# Patient Record
Sex: Female | Born: 1970 | Race: Black or African American | Hispanic: No | Marital: Married | Smoking: Never smoker
Health system: Southern US, Community
[De-identification: ages and names within clinical notes are randomized; demographics above are authoritative.]

## PROBLEM LIST (undated history)

## (undated) DIAGNOSIS — T783XXA Angioneurotic edema, initial encounter: Secondary | ICD-10-CM

## (undated) DIAGNOSIS — L509 Urticaria, unspecified: Secondary | ICD-10-CM

## (undated) DIAGNOSIS — F329 Major depressive disorder, single episode, unspecified: Secondary | ICD-10-CM

## (undated) DIAGNOSIS — G43909 Migraine, unspecified, not intractable, without status migrainosus: Secondary | ICD-10-CM

## (undated) DIAGNOSIS — D649 Anemia, unspecified: Secondary | ICD-10-CM

## (undated) DIAGNOSIS — I10 Essential (primary) hypertension: Secondary | ICD-10-CM

## (undated) DIAGNOSIS — K319 Disease of stomach and duodenum, unspecified: Secondary | ICD-10-CM

## (undated) DIAGNOSIS — K219 Gastro-esophageal reflux disease without esophagitis: Secondary | ICD-10-CM

## (undated) DIAGNOSIS — L309 Dermatitis, unspecified: Secondary | ICD-10-CM

## (undated) DIAGNOSIS — F32A Depression, unspecified: Secondary | ICD-10-CM

## (undated) HISTORY — PX: TUBAL LIGATION: SHX77

## (undated) HISTORY — DX: Anemia, unspecified: D64.9

## (undated) HISTORY — DX: Disease of stomach and duodenum, unspecified: K31.9

## (undated) HISTORY — DX: Angioneurotic edema, initial encounter: T78.3XXA

## (undated) HISTORY — DX: Essential (primary) hypertension: I10

## (undated) HISTORY — DX: Urticaria, unspecified: L50.9

## (undated) HISTORY — DX: Migraine, unspecified, not intractable, without status migrainosus: G43.909

## (undated) HISTORY — DX: Dermatitis, unspecified: L30.9

---

## 2010-07-14 ENCOUNTER — Encounter: Payer: Self-pay | Admitting: Family Medicine

## 2010-07-14 DIAGNOSIS — G43909 Migraine, unspecified, not intractable, without status migrainosus: Secondary | ICD-10-CM | POA: Insufficient documentation

## 2010-07-14 DIAGNOSIS — I10 Essential (primary) hypertension: Secondary | ICD-10-CM | POA: Insufficient documentation

## 2010-07-14 DIAGNOSIS — D649 Anemia, unspecified: Secondary | ICD-10-CM | POA: Insufficient documentation

## 2011-10-09 ENCOUNTER — Emergency Department (INDEPENDENT_AMBULATORY_CARE_PROVIDER_SITE_OTHER)
Admission: EM | Admit: 2011-10-09 | Discharge: 2011-10-09 | Disposition: A | Discharge status: Home / Self Care | Source: Home / Self Care | Attending: Emergency Medicine | Admitting: Emergency Medicine

## 2011-10-09 ENCOUNTER — Emergency Department (INDEPENDENT_AMBULATORY_CARE_PROVIDER_SITE_OTHER)

## 2011-10-09 ENCOUNTER — Encounter (HOSPITAL_COMMUNITY): Payer: Self-pay | Admitting: Emergency Medicine

## 2011-10-09 DIAGNOSIS — M25519 Pain in unspecified shoulder: Secondary | ICD-10-CM

## 2011-10-09 DIAGNOSIS — S46009A Unspecified injury of muscle(s) and tendon(s) of the rotator cuff of unspecified shoulder, initial encounter: Secondary | ICD-10-CM

## 2011-10-09 DIAGNOSIS — M25512 Pain in left shoulder: Secondary | ICD-10-CM

## 2011-10-09 DIAGNOSIS — S4980XA Other specified injuries of shoulder and upper arm, unspecified arm, initial encounter: Secondary | ICD-10-CM

## 2011-10-09 HISTORY — DX: Depression, unspecified: F32.A

## 2011-10-09 HISTORY — DX: Major depressive disorder, single episode, unspecified: F32.9

## 2011-10-09 MED ORDER — ACETAMINOPHEN-CODEINE #3 300-30 MG PO TABS: 1.0000 | ORAL_TABLET | Freq: Four times a day (QID) | ORAL | Status: DC | PRN

## 2011-10-09 MED ORDER — IBUPROFEN 600 MG PO TABS: 600.0000 mg | ORAL_TABLET | Freq: Three times a day (TID) | ORAL | Status: DC

## 2011-10-09 MED ORDER — KETOROLAC TROMETHAMINE 60 MG/2ML IM SOLN
INTRAMUSCULAR | Status: AC
  Filled 2011-10-09: qty 2

## 2011-10-09 MED ORDER — KETOROLAC TROMETHAMINE 60 MG/2ML IM SOLN
60.0000 mg | Freq: Once | INTRAMUSCULAR | Status: AC
  Administered 2011-10-09: 60 mg via INTRAMUSCULAR

## 2011-10-09 NOTE — ED Notes (Signed)
Pt is c/o left should inj since 9:30am... Pt says she fell down a flight of stairs... Sx include: limited range of motion, painful to raise left arm... She also says she hit her head but denies loss of conscious.... Thought the pain would go away and has tried hot baths for the pain.

## 2011-10-09 NOTE — ED Provider Notes (Signed)
History     CSN: [id number]  Arrival date & time 10/09/11  1707   None     Chief Complaint  Patient presents with  . Shoulder Injury    (Consider location/radiation/quality/duration/timing/severity/associated sxs/prior treatment) Patient is a 41 y.o. female presenting with shoulder injury. The history is provided by the patient.  Shoulder Injury This is a new problem. The current episode started 6 to 12 hours ago. The problem occurs constantly. The problem has not changed since onset.The symptoms are aggravated by bending, twisting and exertion. The symptoms are relieved by rest.   Patient reports she tripped going down stairs today, complains of left shoulder pain improved slightly with rest, worsened with movement.  Took tylenol for pain with no improvement.   Past Medical History  Diagnosis Date  . High blood pressure     h/o  . Migraines   . Anemia   . Depression     Past Surgical History  Procedure Date  . Tubal ligation   . Cesarean section     No family history on file.  History  Substance Use Topics  . Smoking status: Never Smoker   . Smokeless tobacco: Not on file  . Alcohol Use: No    OB History    Grav Para Term Preterm Abortions TAB SAB Ect Mult Living                  Review of Systems  Constitutional: Negative.   Respiratory: Negative.   Cardiovascular: Negative.   Musculoskeletal: Positive for arthralgias. Negative for myalgias, back pain, joint swelling and gait problem.  Skin: Negative.     Allergies  Review of patient's allergies indicates no known allergies.  Home Medications   Current Outpatient Rx  Name Route Sig Dispense Refill  . VENLAFAXINE HCL 75 MG PO TABS Oral Take 75 mg by mouth 2 (two) times daily.    . ACETAMINOPHEN-CODEINE #3 300-30 MG PO TABS Oral Take 1 tablet by mouth every 6 (six) hours as needed for pain. 20 tablet 0  . FERROUS SULFATE 325 (65 FE) MG PO TABS Oral Take 325 mg by mouth daily with breakfast.      .  IBUPROFEN 600 MG PO TABS Oral Take 1 tablet (600 mg total) by mouth 3 (three) times daily with meals. 90 tablet 1    BP 148/80  Pulse 109  Temp 99.4 F (37.4 C) (Oral)  Resp 18  Wt 119 lb (53.978 kg)  SpO2 96%  Physical Exam  Nursing note and vitals reviewed. Constitutional: She is oriented to person, place, and time. Vital signs are normal. She appears well-developed and well-nourished. She is active and cooperative.  HENT:  Head: Normocephalic.  Eyes: Conjunctivae normal are normal. Pupils are equal, round, and reactive to light. No scleral icterus.  Neck: Trachea normal. Neck supple.  Cardiovascular: Normal rate, regular rhythm, normal heart sounds and intact distal pulses.   Pulmonary/Chest: Effort normal and breath sounds normal.  Musculoskeletal:       Right shoulder: Normal.       Left shoulder: She exhibits decreased range of motion, tenderness, pain and decreased strength. She exhibits no swelling, no effusion, no crepitus, no deformity, no laceration and no spasm.  Neurological: She is alert and oriented to person, place, and time. No cranial nerve deficit or sensory deficit. GCS eye subscore is 4. GCS verbal subscore is 5. GCS motor subscore is 6.  Skin: Skin is warm and dry.  Psychiatric: She has a  normal mood and affect. Her speech is normal and behavior is normal. Judgment and thought content normal. Cognition and memory are normal.    ED Course  Procedures (including critical care time)  Labs Reviewed - No data to display Dg Shoulder Left  10/09/2011  *RADIOLOGY REPORT*  Clinical Data: Larey Seat down steps today.Left shoulder pain.  LEFT SHOULDER - 2+ VIEW  Comparison: None.  Findings: There is no evidence for acute fracture or dislocation. No soft tissue foreign body or gas identified.  IMPRESSION: Negative exam.   Original Report Authenticated By: Patterson Hammersmith, M.D.      1. Left shoulder pain   2. Rotator cuff injury       MDM  Ibuprofen and tylenol for  pain management as discussed.  Arm sling to rest affected shoulder.  Call orthopedist on Monday morning for follow up appointment and further management of shoulder pain.        Johnsie Kindred, NP 10/11/11 1409

## 2011-10-30 NOTE — ED Provider Notes (Signed)
Medical screening examination/treatment/procedure(s) were performed by non-physician practitioner and as supervising physician I was immediately available for consultation/collaboration.  Leslee Home, M.D.   Reuben Likes, MD 10/30/11 1254

## 2012-04-13 ENCOUNTER — Ambulatory Visit (INDEPENDENT_AMBULATORY_CARE_PROVIDER_SITE_OTHER): Admitting: Physician Assistant

## 2012-04-13 ENCOUNTER — Encounter: Payer: Self-pay | Admitting: Physician Assistant

## 2012-04-13 VITALS — BP 124/70 | HR 96 | Temp 98.6°F | Resp 18 | Ht 64.0 in | Wt 127.0 lb

## 2012-04-13 DIAGNOSIS — M7710 Lateral epicondylitis, unspecified elbow: Secondary | ICD-10-CM

## 2012-04-13 DIAGNOSIS — M7712 Lateral epicondylitis, left elbow: Secondary | ICD-10-CM

## 2012-04-13 DIAGNOSIS — M542 Cervicalgia: Secondary | ICD-10-CM

## 2012-04-13 DIAGNOSIS — L2089 Other atopic dermatitis: Secondary | ICD-10-CM

## 2012-04-13 DIAGNOSIS — L209 Atopic dermatitis, unspecified: Secondary | ICD-10-CM

## 2012-04-13 MED ORDER — MELOXICAM 7.5 MG PO TABS: 7.5000 mg | ORAL_TABLET | Freq: Every day | ORAL | Status: DC

## 2012-04-13 MED ORDER — CYCLOBENZAPRINE HCL 10 MG PO TABS: 10.0000 mg | ORAL_TABLET | Freq: Three times a day (TID) | ORAL | Status: DC | PRN

## 2012-04-13 MED ORDER — TRIAMCINOLONE ACETONIDE 0.1 % EX CREA: TOPICAL_CREAM | Freq: Two times a day (BID) | CUTANEOUS | Status: DC

## 2012-04-13 NOTE — Progress Notes (Signed)
Patient ID: Sayla Golonka MRN: [id number], DOB: Jun 29, 1970, 42 y.o. Date of Encounter: 04/13/2012, 3:56 PM    Chief Complaint:  Chief Complaint  Patient presents with  . Back Pain  . eczema     HPI: 42 y.o. year old female here for eval of above two problems.  1-Pain started one week ago after she "slept wrong"-had "crick" in neck. Now with tightness and pain in back of neck and upper back. 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 Does not hold small children. Has done no heavy lifting and no overhead activity recently. Does no upper body exercise. Has used no otc meds etc to treat her symptoms. Just in the past one-two days has felt some pain in left elbow as well. Otherwise, no pain, numbness, tingling down the arm.   2-Eczema: has patches on chest and back. Has no medication for this.  Home Meds: Current Outpatient Prescriptions on File Prior to Visit  Medication Sig Dispense Refill  . acetaminophen-codeine (TYLENOL #3) 300-30 MG per tablet Take 1 tablet by mouth every 6 (six) hours as needed for pain.  20 tablet  0  . ferrous sulfate 325 (65 FE) MG tablet Take 325 mg by mouth daily with breakfast.        . ibuprofen (ADVIL,MOTRIN) 600 MG tablet Take 1 tablet (600 mg total) by mouth 3 (three) times daily with meals.  90 tablet  1  . venlafaxine (EFFEXOR) 75 MG tablet Take 75 mg by mouth 2 (two) times daily.       No current facility-administered medications on file prior to visit.    Allergies: No Known Allergies    Review of Systems:Pertinent ROS in HPI. Other ROS negative.   Physical Exam: Blood pressure 124/70, pulse 96, temperature 98.6 F (37 C), temperature source Oral, resp. rate 18, height 5\' 4"  (1.626 m), weight 127 lb (57.607 kg), last menstrual period 04/03/2012., Body mass index is 21.79 kg/(m^2). General: Well developed, well nourished,AAF in no acute distress. Neck: Supple. No thyromegaly. Full ROM. No lymphadenopathy. Lungs: Clear bilaterally to  auscultation without wheezes, rales, or rhonchi. Breathing is unlabored. Heart: RRR with S1 S2. No murmurs, rubs, or gallops appreciated. Msk:  Neck: FROM intact. She points to C6-C8 area as area of pain and tightness bilaterally. Says it is mostly on left side. Left shoulder: FROM with no pain. Can fully abduct with no pain. Left Lateral Epicondyle is very painful with palpation. Also has pain at that site when she extends at elbow and hyperextends middle finger.  Extremities/Skin: on her upper back she has multiple areas of hyperpigmentation and slightly raised and slightly rough. I see no other areas of rash.  Neuro: Alert and oriented X 3. Moves all extremities spontaneously. Gait is normal. CNII-XII grossly in tact. Psych:  Responds to questions appropriately with a normal affect.    ASSESSMENT AND PLAN:  42 y.o. year old female with  1. Musculoskeletal neck pain Apply heat. Stretch neck, shoulders. Use these meds. Cautioned reg drowsiness with flexeril and to take mobic with food. If pain/tightness not resolved in 2 weeks or if develops new/worsening symptoms, f/u. - cyclobenzaprine (FLEXERIL) 10 MG tablet; Take 1 tablet (10 mg total) by mouth 3 (three) times daily as needed for muscle spasms.  Dispense: 30 tablet; Refill: 0 - meloxicam (MOBIC) 7.5 MG tablet; Take 1 tablet (7.5 mg total) by mouth daily.  Dispense: 30 tablet; Refill: 1  2. Lateral epicondylitis (tennis elbow), left Demonstrated  stretch for her to do through out the day. Wear tennis elbow strap. Use mobic.  - meloxicam (MOBIC) 7.5 MG tablet; Take 1 tablet (7.5 mg total) by mouth daily.  Dispense: 30 tablet; Refill: 1  3. Atopic dermatitis Use mild soap. Apply lotion liberally esp just after showers. Apply Rx cream sparingly, as little as possible.  - triamcinolone cream (KENALOG) 0.1 %; Apply topically 2 (two) times daily.  Dispense: 30 g; Refill: 0   Signed, [address] [place], [place], [facility] 04/13/2012 3:56 PM

## 2012-04-19 ENCOUNTER — Telehealth: Payer: Self-pay | Admitting: Family Medicine

## 2012-04-19 MED ORDER — CITALOPRAM HYDROBROMIDE 20 MG PO TABS: 20.0000 mg | ORAL_TABLET | Freq: Every day | ORAL | Status: DC

## 2012-04-19 NOTE — Telephone Encounter (Signed)
Refilled Rx per protocol.

## 2012-05-04 ENCOUNTER — Ambulatory Visit
Admission: RE | Admit: 2012-05-04 | Discharge: 2012-05-04 | Disposition: A | Discharge status: Home / Self Care | Source: Ambulatory Visit | Attending: Physician Assistant | Admitting: Physician Assistant

## 2012-05-04 ENCOUNTER — Ambulatory Visit (INDEPENDENT_AMBULATORY_CARE_PROVIDER_SITE_OTHER): Admitting: Physician Assistant

## 2012-05-04 ENCOUNTER — Encounter: Payer: Self-pay | Admitting: Physician Assistant

## 2012-05-04 VITALS — BP 104/72 | HR 80 | Temp 97.6°F | Resp 18 | Ht 64.0 in | Wt 129.0 lb

## 2012-05-04 DIAGNOSIS — M542 Cervicalgia: Secondary | ICD-10-CM

## 2012-05-04 NOTE — Progress Notes (Signed)
Patient ID: Tammy Melton MRN: [id number], DOB: Dec 31, 1970, 42 y.o. Date of Encounter: 05/04/2012, 1:41 PM    Chief Complaint:  Chief Complaint  Patient presents with  . upper back and shoulder pain     HPI: 42 y.o. year old AA female here for f/u of neck pain. Had OV 04/13/12. AT that time reported that one week prior to that she "slept wrong" and had "crick in her neck." But she came in b/c one week later (at time of OV)she was still having pain and tightness in neck. 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 No overhead acitivity etc. Was to use heat, stretches, mobic, and flexeril.  Says flexeril made her drowsy and didn't help much so not taking that. Taking Mobic QD. Tries to stretch. Has scheduled a massage for Friday.  Returns today b/c neck still feels tight and she develops some pain at times. Says after she has been up doing things, or with doing yard work, she feels pain in neck.  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 Zella Richer rode stationary bike -felt pain with that.   No pain, numbness, tingling down either arm.    Home Meds: Current Outpatient Prescriptions on File Prior to Visit  Medication Sig Dispense Refill  . acetaminophen-codeine (TYLENOL #3) 300-30 MG per tablet Take 1 tablet by mouth every 6 (six) hours as needed for pain.  20 tablet  0  . citalopram (CELEXA) 20 MG tablet Take 1 tablet (20 mg total) by mouth daily.  30 tablet  1  . cyclobenzaprine (FLEXERIL) 10 MG tablet Take 1 tablet (10 mg total) by mouth 3 (three) times daily as needed for muscle spasms.  30 tablet  0  . ferrous sulfate 325 (65 FE) MG tablet Take 325 mg by mouth daily with breakfast.        . meloxicam (MOBIC) 7.5 MG tablet Take 1 tablet (7.5 mg total) by mouth daily.  30 tablet  1  . triamcinolone cream (KENALOG) 0.1 % Apply topically 2 (two) times daily.  30 g  0  . ibuprofen (ADVIL,MOTRIN) 600 MG tablet Take 1 tablet (600 mg total) by mouth 3 (three) times  daily with meals.  90 tablet  1   No current facility-administered medications on file prior to visit.    Allergies: No Known Allergies    Review of Systems: See HPI for pertinet ROS. Others negative.   Physical Exam: Blood pressure 104/72, pulse 80, temperature 97.6 F (36.4 C), temperature source Oral, resp. rate 18, height 5\' 4"  (1.626 m), weight 129 lb (58.514 kg), last menstrual period 04/03/2012., Body mass index is 22.13 kg/(m^2). General: Well developed, well nourished,AAF in no acute distress. Neck: I firmly palpated posterior neck and bilateral neck. She reports no pain with palpation. Tilt head to the right: causes pain in right neck. Tilts to left with no pain or tightness. Can turn head to right and to left with mninimal pain or tightness.  5/5 bilateral grip strength and upper extremity strength. Lungs: Clear bilaterally to auscultation without wheezes, rales, or rhonchi. Breathing is unlabored. Heart: Regular rhythm. No murmurs, rubs, or gallops. Msk:  Strength and tone normal for age. Extremities/Skin: Warm and dry. No clubbing or cyanosis. No edema. No rashes or suspicious lesions. Neuro: Alert and oriented X 3. Moves all extremities spontaneously. Gait is normal. CNII-XII grossly in tact. Psych:  Responds to questions appropriately with a normal affect.  ASSESSMENT AND PLAN:  42 y.o. year old female with  1. Neck pain Will obtain XRay to r/o pathology. - DG Cervical Spine Complete; Future Recommend take Flexeril QHS. Cont Mobic. Cont heat and stretching. Agree with massage.   [address] [place], [place], [facility] 05/04/2012 1:41 PM

## 2012-06-15 ENCOUNTER — Other Ambulatory Visit: Payer: Self-pay | Admitting: Family Medicine

## 2012-06-15 ENCOUNTER — Telehealth: Payer: Self-pay | Admitting: Family Medicine

## 2012-06-15 MED ORDER — CITALOPRAM HYDROBROMIDE 20 MG PO TABS: 20.0000 mg | ORAL_TABLET | Freq: Every day | ORAL | Status: DC

## 2012-06-15 NOTE — Telephone Encounter (Signed)
Rx Refilled  

## 2014-10-02 ENCOUNTER — Ambulatory Visit (INDEPENDENT_AMBULATORY_CARE_PROVIDER_SITE_OTHER): Admitting: Family Medicine

## 2014-10-02 ENCOUNTER — Encounter: Payer: Self-pay | Admitting: Family Medicine

## 2014-10-02 VITALS — BP 120/68 | HR 78 | Temp 98.3°F | Resp 18 | Ht 64.0 in | Wt 131.0 lb

## 2014-10-02 DIAGNOSIS — M7711 Lateral epicondylitis, right elbow: Secondary | ICD-10-CM | POA: Diagnosis not present

## 2014-10-02 MED ORDER — DICLOFENAC SODIUM 75 MG PO TBEC: 75.0000 mg | DELAYED_RELEASE_TABLET | Freq: Two times a day (BID) | ORAL | Status: DC

## 2014-10-02 NOTE — Progress Notes (Signed)
   Subjective:    Patient ID: LakiasRaiford Nobleemale    DOB: August 25, 1970, 44 y.o.   MRN: [id number]  HPI  Patient is a 44 year old African-American female who presents with several weeks of pain around her right elbow near the lateral epicondyles. The pain radiates from the lateral epicondyles into the dorsal forearm. It is worse with repetitive use of the hands and with lifting objects and with squeezing objects. She is tender to palpation around the lateral epicondyles.  She also reports burning pain in the extensor muscle group. Past Medical History  Diagnosis Date  . High blood pressure     h/o  . Migraines   . Anemia   . Depression    Past Surgical History  Procedure Laterality Date  . Tubal ligation    . Cesarean section     No current outpatient prescriptions on file prior to visit.   No current facility-administered medications on file prior to visit.   No Known Allergies Social History   Social History  . Marital Status: Married    Spouse Name: N/A  . Number of Children: N/A  . Years of Education: N/A   Occupational History  . Not on file.   Social History Main Topics  . Smoking status: Never Smoker   . Smokeless tobacco: Not on file  . Alcohol Use: No  . Drug Use: No  . Sexual Activity: Not on file   Other Topics Concern  . Not on file   Social History Narrative     Review of Systems  All other systems reviewed and are negative.      Objective:   Physical Exam  Cardiovascular: Normal rate, regular rhythm and normal heart sounds.   Pulmonary/Chest: Effort normal and breath sounds normal.  Musculoskeletal:       Right elbow: She exhibits normal range of motion, no swelling, no effusion and no deformity. Tenderness found. Lateral epicondyle tenderness noted. No radial head, no medial epicondyle and no olecranon process tenderness noted.  Vitals reviewed.         Assessment & Plan:  Tennis elbow, right - Plan: diclofenac (VOLTAREN) 75 MG EC  tablet  Begin diclofenac 75 mg by mouth twice a day. Wear an elbow strap 24 hours a day 7 days a week. Recheck in 2-3 weeks if no better or sooner if worse for cortisone injection

## 2014-10-14 ENCOUNTER — Other Ambulatory Visit: Payer: Self-pay | Admitting: Family Medicine

## 2015-01-07 ENCOUNTER — Encounter (HOSPITAL_COMMUNITY): Payer: Self-pay | Admitting: *Deleted

## 2015-01-07 ENCOUNTER — Emergency Department (HOSPITAL_COMMUNITY)
Admission: EM | Admit: 2015-01-07 | Discharge: 2015-01-08 | Disposition: A | Discharge status: Home / Self Care | Attending: Emergency Medicine | Admitting: Emergency Medicine

## 2015-01-07 ENCOUNTER — Emergency Department (INDEPENDENT_AMBULATORY_CARE_PROVIDER_SITE_OTHER)
Admission: EM | Admit: 2015-01-07 | Discharge: 2015-01-07 | Disposition: A | Discharge status: Other Acute Inpatient Hospital | Source: Home / Self Care | Attending: Family Medicine | Admitting: Family Medicine

## 2015-01-07 ENCOUNTER — Emergency Department (HOSPITAL_COMMUNITY)

## 2015-01-07 ENCOUNTER — Encounter (HOSPITAL_COMMUNITY): Payer: Self-pay | Admitting: Emergency Medicine

## 2015-01-07 DIAGNOSIS — Z8679 Personal history of other diseases of the circulatory system: Secondary | ICD-10-CM | POA: Diagnosis not present

## 2015-01-07 DIAGNOSIS — R112 Nausea with vomiting, unspecified: Secondary | ICD-10-CM | POA: Insufficient documentation

## 2015-01-07 DIAGNOSIS — Z9889 Other specified postprocedural states: Secondary | ICD-10-CM | POA: Insufficient documentation

## 2015-01-07 DIAGNOSIS — R109 Unspecified abdominal pain: Secondary | ICD-10-CM

## 2015-01-07 DIAGNOSIS — Z8659 Personal history of other mental and behavioral disorders: Secondary | ICD-10-CM | POA: Diagnosis not present

## 2015-01-07 DIAGNOSIS — R1032 Left lower quadrant pain: Secondary | ICD-10-CM | POA: Insufficient documentation

## 2015-01-07 DIAGNOSIS — Z862 Personal history of diseases of the blood and blood-forming organs and certain disorders involving the immune mechanism: Secondary | ICD-10-CM | POA: Diagnosis not present

## 2015-01-07 DIAGNOSIS — R102 Pelvic and perineal pain: Secondary | ICD-10-CM | POA: Insufficient documentation

## 2015-01-07 DIAGNOSIS — Z9851 Tubal ligation status: Secondary | ICD-10-CM | POA: Diagnosis not present

## 2015-01-07 DIAGNOSIS — Z3202 Encounter for pregnancy test, result negative: Secondary | ICD-10-CM | POA: Insufficient documentation

## 2015-01-07 LAB — POCT URINALYSIS DIP (DEVICE)
BILIRUBIN URINE: NEGATIVE
GLUCOSE, UA: NEGATIVE mg/dL
Ketones, ur: NEGATIVE mg/dL
LEUKOCYTES UA: NEGATIVE
Nitrite: NEGATIVE
Protein, ur: NEGATIVE mg/dL
UROBILINOGEN UA: 0.2 mg/dL (ref 0.0–1.0)
pH: 5.5 (ref 5.0–8.0)

## 2015-01-07 LAB — COMPREHENSIVE METABOLIC PANEL
ALT: 14 U/L (ref 14–54)
AST: 16 U/L (ref 15–41)
Albumin: 4.2 g/dL (ref 3.5–5.0)
Alkaline Phosphatase: 46 U/L (ref 38–126)
Anion gap: 9 (ref 5–15)
BILIRUBIN TOTAL: 0.4 mg/dL (ref 0.3–1.2)
BUN: 11 mg/dL (ref 6–20)
CHLORIDE: 106 mmol/L (ref 101–111)
CO2: 25 mmol/L (ref 22–32)
Calcium: 9.8 mg/dL (ref 8.9–10.3)
Creatinine, Ser: 0.79 mg/dL (ref 0.44–1.00)
Glucose, Bld: 104 mg/dL — ABNORMAL HIGH (ref 65–99)
POTASSIUM: 3.7 mmol/L (ref 3.5–5.1)
Sodium: 140 mmol/L (ref 135–145)
TOTAL PROTEIN: 7.6 g/dL (ref 6.5–8.1)

## 2015-01-07 LAB — I-STAT BETA HCG BLOOD, ED (MC, WL, AP ONLY): I-stat hCG, quantitative: <5 m[IU]/mL (ref <5)

## 2015-01-07 LAB — WET PREP, GENITAL
Clue Cells Wet Prep HPF POC: NONE SEEN
Sperm: NONE SEEN
TRICH WET PREP: NONE SEEN
YEAST WET PREP: NONE SEEN

## 2015-01-07 LAB — CBC
HEMATOCRIT: 34.4 % — AB (ref 36.0–46.0)
Hemoglobin: 10.7 g/dL — ABNORMAL LOW (ref 12.0–15.0)
MCH: 22.7 pg — ABNORMAL LOW (ref 26.0–34.0)
MCHC: 31.1 g/dL (ref 30.0–36.0)
MCV: 73 fL — AB (ref 78.0–100.0)
PLATELETS: 345 10*3/uL (ref 150–400)
RBC: 4.71 MIL/uL (ref 3.87–5.11)
RDW: 18.3 % — AB (ref 11.5–15.5)
WBC: 7.6 10*3/uL (ref 4.0–10.5)

## 2015-01-07 MED ORDER — ONDANSETRON HCL 4 MG/2ML IJ SOLN
4.0000 mg | Freq: Once | INTRAMUSCULAR | Status: AC
  Administered 2015-01-07: 4 mg via INTRAVENOUS
  Filled 2015-01-07: qty 2

## 2015-01-07 MED ORDER — KETOROLAC TROMETHAMINE 30 MG/ML IJ SOLN
30.0000 mg | Freq: Once | INTRAMUSCULAR | Status: AC
  Administered 2015-01-07: 30 mg via INTRAVENOUS
  Filled 2015-01-07: qty 1

## 2015-01-07 MED ORDER — SODIUM CHLORIDE 0.9 % IV BOLUS (SEPSIS)
1000.0000 mL | Freq: Once | INTRAVENOUS | Status: AC
  Administered 2015-01-07: 1000 mL via INTRAVENOUS

## 2015-01-07 NOTE — ED Provider Notes (Signed)
CSN: [id number]     Arrival date & time 01/07/15  1958 History   First MD Initiated Contact with Patient 01/07/15 2145     Chief Complaint  Patient presents with  . Abdominal Pain     (Consider location/radiation/quality/duration/timing/severity/associated sxs/prior Treatment) Patient is a 45 y.o. female presenting with abdominal pain.  Abdominal Pain Pain location:  LLQ Pain quality: cramping and sharp   Pain quality comment:  "like contractions" Pain radiates to:  Does not radiate Pain severity:  Moderate Onset quality:  Gradual Duration:  3 days Timing:  Constant Progression:  Worsening Chronicity:  New Relieved by:  Nothing Associated symptoms: nausea and vomiting (x1 yesterday only)   Associated symptoms: no chest pain, no diarrhea, no dysuria, no fever, no hematemesis, no hematochezia, no hematuria, no melena, no shortness of breath, no vaginal bleeding and no vaginal discharge   Risk factors: has not had multiple surgeries, not obese and not pregnant     Past Medical History  Diagnosis Date  . High blood pressure     h/o  . Migraines   . Anemia   . Depression    Past Surgical History  Procedure Laterality Date  . Tubal ligation    . Cesarean section     No family history on file. Social History  Substance Use Topics  . Smoking status: Never Smoker   . Smokeless tobacco: None  . Alcohol Use: No   OB History    No data available      Review of Systems  Constitutional: Negative for fever.  Respiratory: Negative for shortness of breath.   Cardiovascular: Negative for chest pain.  Gastrointestinal: Positive for nausea, vomiting (x1 yesterday only) and abdominal pain. Negative for diarrhea, blood in stool, melena, hematochezia and hematemesis.  Genitourinary: Negative for dysuria, hematuria, vaginal bleeding and vaginal discharge.    Allergies  Review of patient's allergies indicates no known allergies.  Home Medications   Prior to Admission  medications   Medication Sig Start Date End Date Taking? Authorizing Provider  diclofenac (VOLTAREN) 75 MG EC tablet TAKE 1 TABLET (75 MG TOTAL) BY MOUTH 2 (TWO) TIMES DAILY. Patient not taking: Reported on 01/07/2015 10/15/14   Donita Brooks, MD   BP 113/69 mmHg  Pulse 95  Temp(Src) 98.9 F (37.2 C) (Oral)  Resp 18  SpO2 100%  LMP 01/03/2015   Physical Exam  Constitutional: She is oriented to person, place, and time. She appears well-developed and well-nourished. No distress.   Nontoxic/nonseptic appearing  HENT:  Head: Normocephalic and atraumatic.  Eyes: Conjunctivae and EOM are normal. No scleral icterus.  Neck: Normal range of motion.  Cardiovascular: Regular rhythm and intact distal pulses.   Mild tachycardia  Pulmonary/Chest: Effort normal. No respiratory distress. She has no wheezes. She has no rales.  Abdominal: Soft. Normal appearance. She exhibits no distension, no ascites and no mass. There is tenderness in the left lower quadrant. There is guarding. There is no rigidity, no tenderness at McBurney's point and negative Murphy's sign.    Tenderness to palpation in the left lower quadrant with mild voluntary guarding. No rigidity or peritoneal signs. No CVA tenderness. Abdomen soft.  Genitourinary: There is no rash, tenderness or lesion on the right labia. There is no rash, tenderness or lesion on the left labia. Uterus is tender (mild). Cervix exhibits friability. Cervix exhibits no motion tenderness. Right adnexum displays no mass, no tenderness and no fullness. Left adnexum displays tenderness. No tenderness or bleeding in the vagina.  Musculoskeletal: Normal range of motion.  Neurological: She is alert and oriented to person, place, and time. She exhibits normal muscle tone. Coordination normal.   Patient moving all extremities  Skin: Skin is warm and dry. No rash noted. She is not diaphoretic. No erythema. No pallor.  Psychiatric: She has a normal mood and affect. Her  behavior is normal.  Nursing note and vitals reviewed.   ED Course  Procedures (including critical care time) Labs Review Labs Reviewed  WET PREP, GENITAL - Abnormal; Notable for the following:    WBC, Wet Prep HPF POC MANY (*)    All other components within normal limits  COMPREHENSIVE METABOLIC PANEL - Abnormal; Notable for the following:    Glucose, Bld 104 (*)    All other components within normal limits  CBC - Abnormal; Notable for the following:    Hemoglobin 10.7 (*)    HCT 34.4 (*)    MCV 73.0 (*)    MCH 22.7 (*)    RDW 18.3 (*)    All other components within normal limits  I-STAT BETA HCG BLOOD, ED ([facility], [facility], [facility] ONLY)  GC/CHLAMYDIA PROBE AMP ([place]) [facility]    Imaging Review [place] Transvaginal Non-ob  01/07/2015  CLINICAL DATA:  Left lower quadrant pain began on Saturday. Nausea and emesis x1 on Saturday. EXAM: TRANSABDOMINAL AND TRANSVAGINAL ULTRASOUND OF PELVIS DOPPLER ULTRASOUND OF OVARIES TECHNIQUE: Both transabdominal and transvaginal ultrasound examinations of the pelvis were performed. Transabdominal technique was performed for global imaging of the pelvis including uterus, ovaries, adnexal regions, and pelvic cul-de-sac. It was necessary to proceed with endovaginal exam following the transabdominal exam to visualize the endometrium and ovaries. Color and duplex Doppler ultrasound was utilized to evaluate blood flow to the ovaries. COMPARISON:  None. FINDINGS: Uterus Measurements: 9.6 x 5.5 by 6.6 cm. No fibroids or other mass visualized. Endometrium Thickness: 4.5 mm.  No focal abnormality visualized. Right ovary Measurements: 2.3 x 1.0 x 2.2 cm. Normal appearance/no adnexal mass. Left ovary Measurements: 3.4 x 1.2 x 3.2 cm. Normal appearance/no adnexal mass. Pulsed Doppler evaluation of both ovaries demonstrates normal low-resistance arterial and venous waveforms. Other findings Trace free pelvic fluid noted. IMPRESSION: Normal pelvic ultrasound. Electronically Signed    By: Norva PavlovElizabeth  Brown M.D.   On: 01/07/2015 23:53   [place] Pelvis Complete  01/07/2015  CLINICAL DATA:  Left lower quadrant pain began on Saturday. Nausea and emesis x1 on Saturday. EXAM: TRANSABDOMINAL AND TRANSVAGINAL ULTRASOUND OF PELVIS DOPPLER ULTRASOUND OF OVARIES TECHNIQUE: Both transabdominal and transvaginal ultrasound examinations of the pelvis were performed. Transabdominal technique was performed for global imaging of the pelvis including uterus, ovaries, adnexal regions, and pelvic cul-de-sac. It was necessary to proceed with endovaginal exam following the transabdominal exam to visualize the endometrium and ovaries. Color and duplex Doppler ultrasound was utilized to evaluate blood flow to the ovaries. COMPARISON:  None. FINDINGS: Uterus Measurements: 9.6 x 5.5 by 6.6 cm. No fibroids or other mass visualized. Endometrium Thickness: 4.5 mm.  No focal abnormality visualized. Right ovary Measurements: 2.3 x 1.0 x 2.2 cm. Normal appearance/no adnexal mass. Left ovary Measurements: 3.4 x 1.2 x 3.2 cm. Normal appearance/no adnexal mass. Pulsed Doppler evaluation of both ovaries demonstrates normal low-resistance arterial and venous waveforms. Other findings Trace free pelvic fluid noted. IMPRESSION: Normal pelvic ultrasound. Electronically Signed   By: Norva PavlovElizabeth  Brown M.D.   On: 01/07/2015 23:53   Ct Abdomen Pelvis W Contrast  01/08/2015  CLINICAL DATA:  Left lower quadrant abdominal  pain. Nausea and vomiting. Symptoms for several days. EXAM: CT ABDOMEN AND PELVIS WITH CONTRAST TECHNIQUE: Multidetector CT imaging of the abdomen and pelvis was performed using the standard protocol following bolus administration of intravenous contrast. CONTRAST:  OMNIPAQUE IOHEXOL 300 MG/ML  SOLN COMPARISON:  Pelvic ultrasound 1 day prior. FINDINGS: Lower chest:  The included lung bases are clear. Liver: Tiny 2-3 mm hypodensity in the liver, too small to characterize. Minimal focal fatty infiltration adjacent with  falciform ligament. Hepatobiliary: Gallbladder physiologically distended, no calcified stone. No biliary dilatation. Pancreas: Normal.  No ductal dilatation or inflammation. Spleen: Normal. Adrenal glands: No nodule. Kidneys: Symmetric renal enhancement and excretion. No hydronephrosis. Accessory left renal artery noted. Stomach/Bowel: Stomach physiologically distended. There are no dilated or thickened small bowel loops. Small volume of stool throughout the colon without colonic wall thickening. The appendix is not confidently identified, no pericecal or right lower quadrant inflammatory change. Vascular/Lymphatic: No retroperitoneal adenopathy. Abdominal aorta is normal in caliber. Reproductive: No adnexal mass. Mildly prominent periuterine vascularity. Bladder: Minimally distended, no wall thickening. Other: No free air, free fluid, or intra-abdominal fluid collection. Minimal free fluid in the pelvis is physiologic. Musculoskeletal: There are no acute or suspicious osseous abnormalities. Incidental note of transitional lumbosacral anatomy. IMPRESSION: No acute abnormality in the abdomen/pelvis. Electronically Signed   By: Rubye Oaks M.D.   On: 01/08/2015 01:32   [place] Art/ven Flow Abd Pelv Doppler  01/07/2015  CLINICAL DATA:  Left lower quadrant pain began on Saturday. Nausea and emesis x1 on Saturday. EXAM: TRANSABDOMINAL AND TRANSVAGINAL ULTRASOUND OF PELVIS DOPPLER ULTRASOUND OF OVARIES TECHNIQUE: Both transabdominal and transvaginal ultrasound examinations of the pelvis were performed. Transabdominal technique was performed for global imaging of the pelvis including uterus, ovaries, adnexal regions, and pelvic cul-de-sac. It was necessary to proceed with endovaginal exam following the transabdominal exam to visualize the endometrium and ovaries. Color and duplex Doppler ultrasound was utilized to evaluate blood flow to the ovaries. COMPARISON:  None. FINDINGS: Uterus Measurements: 9.6 x 5.5 by 6.6 cm.  No fibroids or other mass visualized. Endometrium Thickness: 4.5 mm.  No focal abnormality visualized. Right ovary Measurements: 2.3 x 1.0 x 2.2 cm. Normal appearance/no adnexal mass. Left ovary Measurements: 3.4 x 1.2 x 3.2 cm. Normal appearance/no adnexal mass. Pulsed Doppler evaluation of both ovaries demonstrates normal low-resistance arterial and venous waveforms. Other findings Trace free pelvic fluid noted. IMPRESSION: Normal pelvic ultrasound. Electronically Signed   By: Norva Pavlov M.D.   On: 01/07/2015 23:53     I have personally reviewed and evaluated these images and lab results as part of my medical decision-making.   EKG Interpretation None      MDM   Final diagnoses:  Abdominal pain, left lower quadrant    45 year old female transferred to the emergency department from urgent care for evaluation of left lower quadrant abdominal pain. Pain improved with Toradol and fluids. Patient with a noncontributory laboratory workup. Wet prep did show many white blood cells, but patient denies concern for STDs. Gonorrhea/chlamydia cultures pending. She had a CT abdomen pelvis as well as a pelvic ultrasound, both of which were unremarkable and showed no evidence of acute process.  Given resolution of symptoms and reassuring workup, I do not believe further emergent testing is indicated. Patient to be referred to her primary care doctor who may refer her to a GI physician as needed. Patient prescribed naproxen and given return precautions. Patient discharged in good condition with no unaddressed concerns.   Filed Vitals:  01/07/15 2230 01/08/15 0000 01/08/15 0115 01/08/15 0130  BP: 126/73 113/69 113/75 117/75  Pulse: 102 95 91 88  Temp:    98.3 F (36.8 C)  TempSrc:    Oral  Resp: 18     SpO2: 100% 100% 100% 100%     Antony Madura, PA-C 01/08/15 0210  Donnetta Hutching, MD 01/11/15 1443

## 2015-01-07 NOTE — ED Notes (Signed)
Pt. reports LLQ pain onset Saturday with nausea and emesis x1 onset Saturday , denies fever /no diarrhea , pain worsened last night , seen at urgent care this evening sent here for further evaluation .

## 2015-01-07 NOTE — ED Notes (Signed)
Pt  Reports  Left  Lower  Quadrant  Pain  With  Onset     sev  Days  Ago   denys  Any   Discharge  Or  Bleeding              pt  Reports   Some  Nausea   No  Vomiting

## 2015-01-07 NOTE — ED Provider Notes (Signed)
CSN: [id number]     Arrival date & time 01/07/15  1715 History   First MD Initiated Contact with Patient 01/07/15 1912     Chief Complaint  Patient presents with  . Abdominal Pain   (Consider location/radiation/quality/duration/timing/severity/associated sxs/prior Treatment) Patient is a 45 y.o. female presenting with abdominal pain. The history is provided by the patient and the spouse.  Abdominal Pain Pain location:  L flank and LLQ Pain quality: cramping and sharp   Pain radiates to:  Does not radiate Pain severity:  Moderate Onset quality:  Sudden Duration:  1 day Progression:  Worsening Chronicity:  New Relieved by:  Nothing Ineffective treatments:  Lying down and not moving Associated symptoms: nausea   Associated symptoms: no constipation, no diarrhea, no dysuria, no fever, no shortness of breath, no vaginal bleeding, no vaginal discharge and no vomiting     Past Medical History  Diagnosis Date  . High blood pressure     h/o  . Migraines   . Anemia   . Depression    Past Surgical History  Procedure Laterality Date  . Tubal ligation    . Cesarean section     History reviewed. No pertinent family history. Social History  Substance Use Topics  . Smoking status: Never Smoker   . Smokeless tobacco: None  . Alcohol Use: No   OB History    No data available     Review of Systems  Constitutional: Negative for fever.  HENT: Negative.   Respiratory: Negative for shortness of breath.   Cardiovascular: Negative.   Gastrointestinal: Positive for nausea and abdominal pain. Negative for vomiting, diarrhea and constipation.  Genitourinary: Negative for dysuria, vaginal bleeding and vaginal discharge.  All other systems reviewed and are negative.   Allergies  Review of patient's allergies indicates no known allergies.  Home Medications   Prior to Admission medications   Medication Sig Start Date End Date Taking? Authorizing Provider  diclofenac (VOLTAREN) 75 MG  EC tablet TAKE 1 TABLET (75 MG TOTAL) BY MOUTH 2 (TWO) TIMES DAILY. 10/15/14   Donita BrooksWarren T Pickard, MD   Meds Ordered and Administered this Visit  Medications - No data to display  BP 149/77 mmHg  Pulse 118  Temp(Src) 98 F (36.7 C) (Oral)  Resp 20  SpO2 100%  LMP 01/03/2015 No data found.   Physical Exam  Constitutional: She is oriented to person, place, and time. She appears well-developed and well-nourished. She appears distressed.  Pulmonary/Chest: Effort normal and breath sounds normal.  Abdominal: She exhibits no distension and no mass. There is tenderness. There is guarding. There is no rebound.  Neurological: She is alert and oriented to person, place, and time.  Skin: Skin is warm and dry.  Nursing note and vitals reviewed.   ED Course  Procedures (including critical care time)  Labs Review Labs Reviewed  POCT URINALYSIS DIP (DEVICE) - Abnormal; Notable for the following:    Hgb urine dipstick SMALL (*)    All other components within normal limits    Imaging Review No results found.   Visual Acuity Review  Right Eye Distance:   Left Eye Distance:   Bilateral Distance:    Right Eye Near:   Left Eye Near:    Bilateral Near:         MDM   1. Abdominal pain in female patient    Sent for eval of acute abd pain llq, uncertain etiol.    Linna HoffJames D Isaiahs Chancy, MD 01/07/15 [phone]

## 2015-01-08 ENCOUNTER — Encounter: Payer: Self-pay | Admitting: Family Medicine

## 2015-01-08 ENCOUNTER — Encounter (HOSPITAL_COMMUNITY): Payer: Self-pay | Admitting: Radiology

## 2015-01-08 ENCOUNTER — Ambulatory Visit (INDEPENDENT_AMBULATORY_CARE_PROVIDER_SITE_OTHER): Admitting: Family Medicine

## 2015-01-08 ENCOUNTER — Emergency Department (HOSPITAL_COMMUNITY)

## 2015-01-08 VITALS — BP 132/84 | HR 96 | Temp 98.2°F | Resp 14 | Ht 64.0 in | Wt 133.0 lb

## 2015-01-08 DIAGNOSIS — R1032 Left lower quadrant pain: Secondary | ICD-10-CM

## 2015-01-08 LAB — POCT PREGNANCY, URINE: Preg Test, Ur: NEGATIVE

## 2015-01-08 LAB — URINALYSIS, ROUTINE W REFLEX MICROSCOPIC
BILIRUBIN URINE: NEGATIVE
GLUCOSE, UA: NEGATIVE
LEUKOCYTES UA: NEGATIVE
Nitrite: NEGATIVE
PH: 6 (ref 5.0–8.0)
Protein, ur: NEGATIVE
Specific Gravity, Urine: 1.025 (ref 1.001–1.035)

## 2015-01-08 LAB — URINALYSIS, MICROSCOPIC ONLY
CASTS: NONE SEEN [LPF]
CRYSTALS: NONE SEEN [HPF]
YEAST: NONE SEEN [HPF]

## 2015-01-08 LAB — GC/CHLAMYDIA PROBE AMP (~~LOC~~) NOT AT ARMC
Chlamydia: NEGATIVE
Neisseria Gonorrhea: NEGATIVE

## 2015-01-08 MED ORDER — NAPROXEN 500 MG PO TABS: 500.0000 mg | ORAL_TABLET | Freq: Two times a day (BID) | ORAL | Status: DC

## 2015-01-08 MED ORDER — KETOROLAC TROMETHAMINE 60 MG/2ML IM SOLN
60.0000 mg | Freq: Once | INTRAMUSCULAR | Status: AC
  Administered 2015-01-08: 60 mg via INTRAMUSCULAR

## 2015-01-08 MED ORDER — TAMSULOSIN HCL 0.4 MG PO CAPS: 0.4000 mg | ORAL_CAPSULE | Freq: Every day | ORAL | Status: DC

## 2015-01-08 MED ORDER — OXYCODONE-ACETAMINOPHEN 10-325 MG PO TABS: 1.0000 | ORAL_TABLET | ORAL | Status: DC | PRN

## 2015-01-08 MED ORDER — IOHEXOL 300 MG/ML  SOLN
100.0000 mL | Freq: Once | INTRAMUSCULAR | Status: AC | PRN
  Administered 2015-01-08: 100 mL via INTRAVENOUS

## 2015-01-08 NOTE — Progress Notes (Signed)
Subjective:    Patient ID: Tammy Melton, female    DOB: 1970-12-25, 45 y.o.   MRN: [id number]  HPI  Was seen in the ER yesterday for abd/pelvic pain in LLQ.  Ct of abd and pelvis along with pelvic US were negative for any acute process.  She is here for follow up.  States the symptoms began on Saturday. Symptoms were mild left lower quadrant abdominal pain. Sunday they intensified. The abdominal pain is colicky in nature it comes and goes similar to menstrual cramps. However it is much more intense. Starting yesterday the pain began to radiate into her lower back. She   did have trace hematuria. She denies any dysuria. She denies any fevers or chills. She denies any nausea or vomiting. She denies any blood in her stool. Last bowel movement was Sunday. However she is having normal flatus. Food does not exacerbate the pain. There are no exacerbating or alleviating factors. The pain seems to come and go at will.   Recent Results (from the past 2160 hour(s))  POCT urinalysis dip (device)     Status: Abnormal   Collection Time: 01/07/15  7:26 PM  Result Value Ref Range   Glucose, UA NEGATIVE NEGATIVE mg/dL   Bilirubin Urine NEGATIVE NEGATIVE   Ketones, ur NEGATIVE NEGATIVE mg/dL   Specific Gravity, Urine >=1.030 1.005 - 1.030   Hgb urine dipstick SMALL (A) NEGATIVE   pH 5.5 5.0 - 8.0   Protein, ur NEGATIVE NEGATIVE mg/dL   Urobilinogen, UA 0.2 0.0 - 1.0 mg/dL   Nitrite NEGATIVE NEGATIVE   Leukocytes, UA NEGATIVE NEGATIVE    Comment: Biochemical Testing Only. Please order routine urinalysis from main lab if confirmatory testing is needed.  Comprehensive metabolic panel     Status: Abnormal   Collection Time: 01/07/15  8:34 PM  Result Value Ref Range   Sodium 140 135 - 145 mmol/L   Potassium 3.7 3.5 - 5.1 mmol/L   Chloride 106 101 - 111 mmol/L   CO2 25 22 - 32 mmol/L   Glucose, Bld 104 (H) 65 - 99 mg/dL   BUN 11 6 - 20 mg/dL   Creatinine, Ser 0.79 0.44 - 1.00 mg/dL   Calcium 9.8 8.9 -  10.3 mg/dL   Total Protein 7.6 6.5 - 8.1 g/dL   Albumin 4.2 3.5 - 5.0 g/dL   AST 16 15 - 41 U/L   ALT 14 14 - 54 U/L   Alkaline Phosphatase 46 38 - 126 U/L   Total Bilirubin 0.4 0.3 - 1.2 mg/dL   GFR calc non Af Amer >60 >60 mL/min   GFR calc Af Amer >60 >60 mL/min    Comment: (NOTE) The eGFR has been calculated using the CKD EPI equation. This calculation has not been validated in all clinical situations. eGFR's persistently <60 mL/min signify possible Chronic Kidney Disease.    Anion gap 9 5 - 15  CBC     Status: Abnormal   Collection Time: 01/07/15  8:34 PM  Result Value Ref Range   WBC 7.6 4.0 - 10.5 K/uL   RBC 4.71 3.87 - 5.11 MIL/uL   Hemoglobin 10.7 (L) 12.0 - 15.0 g/dL   HCT 34.4 (L) 36.0 - 46.0 %   MCV 73.0 (L) 78.0 - 100.0 fL   MCH 22.7 (L) 26.0 - 34.0 pg   MCHC 31.1 30.0 - 36.0 g/dL   RDW 18.3 (H) 11.5 - 15.5 %   Platelets 345 150 - 400 K/uL  I-Stat beta hCG  blood, ED ([facility], [facility], [facility] only)     Status: None   Collection Time: 01/07/15  8:44 PM  Result Value Ref Range   I-stat hCG, quantitative <5.0 <5 mIU/mL   Comment 3            Comment:   GEST. AGE      CONC.  (mIU/mL)   <=1 WEEK        5 - 50     2 WEEKS       50 - 500     3 WEEKS       100 - 10,000     4 WEEKS     1,000 - 30,000        FEMALE AND NON-PREGNANT FEMALE:     LESS THAN 5 mIU/mL   Wet prep, genital     Status: Abnormal   Collection Time: 01/07/15 10:45 PM  Result Value Ref Range   Yeast Wet Prep HPF POC NONE SEEN NONE SEEN   Trich, Wet Prep NONE SEEN NONE SEEN   Clue Cells Wet Prep HPF POC NONE SEEN NONE SEEN   WBC, Wet Prep HPF POC MANY (A) NONE SEEN   Sperm NONE SEEN     Past Medical History  Diagnosis Date  . High blood pressure     h/o  . Migraines   . Anemia   . Depression    Past Surgical History  Procedure Laterality Date  . Tubal ligation    . Cesarean section     Current Outpatient Prescriptions on File Prior to Visit  Medication Sig Dispense Refill  . diclofenac  (VOLTAREN) 75 MG EC tablet TAKE 1 TABLET (75 MG TOTAL) BY MOUTH 2 (TWO) TIMES DAILY. (Patient not taking: Reported on 01/07/2015) 30 tablet 0  . naproxen (NAPROSYN) 500 MG tablet Take 1 tablet (500 mg total) by mouth 2 (two) times daily. 30 tablet 0   No current facility-administered medications on file prior to visit.   No Known Allergies Social History   Social History  . Marital Status: Married    Spouse Name: N/A  . Number of Children: N/A  . Years of Education: N/A   Occupational History  . Not on file.   Social History Main Topics  . Smoking status: Never Smoker   . Smokeless tobacco: Not on file  . Alcohol Use: No  . Drug Use: No  . Sexual Activity: Not on file   Other Topics Concern  . Not on file   Social History Narrative     Review of Systems  All other systems reviewed and are negative.      Objective:   Physical Exam  Constitutional: She appears well-developed and well-nourished. She appears distressed.  HENT:  Head: Normocephalic.  Right Ear: External ear normal.  Left Ear: External ear normal.  Nose: Nose normal.  Cardiovascular: Normal rate, regular rhythm and normal heart sounds.  Exam reveals no gallop and no friction rub.   No murmur heard. Pulmonary/Chest: Effort normal and breath sounds normal. No respiratory distress. She has no wheezes. She has no rales.  Abdominal: She exhibits no distension and no mass. There is tenderness. There is no rebound and no guarding.  Skin: She is not diaphoretic.  Vitals reviewed.  The patient is pacing in hte clinic due to the colicky pain       Assessment & Plan:  Colicky LLQ abdominal pain - Plan: Urinalysis, Routine w reflex microscopic (not at [facility]) UA shows trace  blood.  Symptoms and history and behavior suggest kidney stone which was possibly obscurred on contrasted CT scan.  No sign or symptom of infection, obstruction, or ovarian torsion.  I will treat empirically as nephrolithiasis with toradol 60  mg im x1, percocet 10/325 poq4 hr prn and flomax 0.4 mg poqday.  Recheck Friday or immediately if worsening or changing.

## 2015-01-08 NOTE — Discharge Instructions (Signed)

## 2015-01-10 ENCOUNTER — Emergency Department (HOSPITAL_COMMUNITY)

## 2015-01-10 ENCOUNTER — Encounter (HOSPITAL_COMMUNITY): Payer: Self-pay | Admitting: Emergency Medicine

## 2015-01-10 ENCOUNTER — Ambulatory Visit (INDEPENDENT_AMBULATORY_CARE_PROVIDER_SITE_OTHER): Admitting: Family Medicine

## 2015-01-10 ENCOUNTER — Emergency Department (HOSPITAL_COMMUNITY)
Admission: EM | Admit: 2015-01-10 | Discharge: 2015-01-10 | Disposition: A | Discharge status: Home / Self Care | Attending: Emergency Medicine | Admitting: Emergency Medicine

## 2015-01-10 ENCOUNTER — Encounter: Payer: Self-pay | Admitting: Family Medicine

## 2015-01-10 VITALS — BP 130/68 | HR 100 | Temp 98.6°F | Resp 16 | Wt 134.0 lb

## 2015-01-10 DIAGNOSIS — K5900 Constipation, unspecified: Secondary | ICD-10-CM | POA: Diagnosis not present

## 2015-01-10 DIAGNOSIS — G43909 Migraine, unspecified, not intractable, without status migrainosus: Secondary | ICD-10-CM | POA: Insufficient documentation

## 2015-01-10 DIAGNOSIS — Z3202 Encounter for pregnancy test, result negative: Secondary | ICD-10-CM | POA: Insufficient documentation

## 2015-01-10 DIAGNOSIS — Z862 Personal history of diseases of the blood and blood-forming organs and certain disorders involving the immune mechanism: Secondary | ICD-10-CM | POA: Diagnosis not present

## 2015-01-10 DIAGNOSIS — Z8659 Personal history of other mental and behavioral disorders: Secondary | ICD-10-CM | POA: Insufficient documentation

## 2015-01-10 DIAGNOSIS — R110 Nausea: Secondary | ICD-10-CM | POA: Diagnosis not present

## 2015-01-10 DIAGNOSIS — R1032 Left lower quadrant pain: Secondary | ICD-10-CM

## 2015-01-10 DIAGNOSIS — R000 Tachycardia, unspecified: Secondary | ICD-10-CM | POA: Diagnosis not present

## 2015-01-10 DIAGNOSIS — Z79899 Other long term (current) drug therapy: Secondary | ICD-10-CM | POA: Insufficient documentation

## 2015-01-10 DIAGNOSIS — R109 Unspecified abdominal pain: Secondary | ICD-10-CM

## 2015-01-10 LAB — URINALYSIS, ROUTINE W REFLEX MICROSCOPIC
Bilirubin Urine: NEGATIVE
Glucose, UA: NEGATIVE mg/dL
Hgb urine dipstick: NEGATIVE
Ketones, ur: NEGATIVE mg/dL
LEUKOCYTES UA: NEGATIVE
Nitrite: NEGATIVE
PROTEIN: NEGATIVE mg/dL
SPECIFIC GRAVITY, URINE: 1.005 (ref 1.005–1.030)
pH: 7.5 (ref 5.0–8.0)

## 2015-01-10 LAB — POC URINE PREG, ED: PREG TEST UR: NEGATIVE

## 2015-01-10 MED ORDER — OXYCODONE-ACETAMINOPHEN 5-325 MG PO TABS
1.0000 | ORAL_TABLET | ORAL | Status: DC | PRN
  Administered 2015-01-10: 1 via ORAL
  Filled 2015-01-10: qty 1

## 2015-01-10 MED ORDER — FLEET ENEMA 7-19 GM/118ML RE ENEM
1.0000 | ENEMA | Freq: Once | RECTAL | Status: AC
  Administered 2015-01-10: 1 via RECTAL
  Filled 2015-01-10: qty 1

## 2015-01-10 NOTE — ED Provider Notes (Signed)
CSN: [id number]     Arrival date & time 01/10/15  1104 History   First MD Initiated Contact with Patient 01/10/15 1453     Chief Complaint  Patient presents with  . Abdominal Pain   HPI  [name] is a 45 year old female with PMHx of HTN, migraines, anemia and depression presenting with abdominal pain. She has been evaluated by her PCP twice and ED 3 days ago for same complaint. Onset of the pain was 6 days ago. The pain is located in her LLQ and has been gradually worsening since onset. The pain is described as severe cramping. The pain was initially waxing and waning but is now constant. Associated symptoms include nausea and vomiting. She has vomited twice in the past 6 days. No vomiting or nausea today. She has not had a bowel movement in the past 5 days. She is still passing gas. She has not taken any OTC laxatives or stool softeners. At her visit on 1/2, she had CT scan of abdomen, pelvic exam and transvaginal ultrasound which did not show any source of pain. She was noted to have many WBC on wet prep but she denies possible STD exposure. She followed up with her PCP today who sent her back to the ED for possible surgical abdomen. Denies fevers, chills, headaches, syncope, chest pain, SOB, hematemesis, diarrhea, melena, hematochezia, vaginal discharge, dysuria, flank pain or pelvic pain.   Past Medical History  Diagnosis Date  . High blood pressure     h/o  . Migraines   . Anemia   . Depression    Past Surgical History  Procedure Laterality Date  . Tubal ligation    . Cesarean section     No family history on file. Social History  Substance Use Topics  . Smoking status: Never Smoker   . Smokeless tobacco: None  . Alcohol Use: No   OB History    No data available     Review of Systems  Constitutional: Negative for fever and chills.  Respiratory: Negative for shortness of breath.   Cardiovascular: Negative for chest pain.  Gastrointestinal: Positive for nausea, vomiting,  abdominal pain and constipation. Negative for diarrhea, blood in stool and abdominal distention.  Genitourinary: Negative for dysuria, hematuria, flank pain and vaginal discharge.  Neurological: Negative for dizziness, syncope, weakness and headaches.  All other systems reviewed and are negative.     Allergies  Review of patient's allergies indicates no known allergies.  Home Medications   Prior to Admission medications   Medication Sig Start Date End Date Taking? Authorizing Provider  aspirin-acetaminophen-caffeine (EXCEDRIN MIGRAINE) [phone] MG tablet Take 2 tablets by mouth 2 (two) times daily as needed for headache.   Yes Historical Provider, MD  bismuth subsalicylate (PEPTO BISMOL) 262 MG/15ML suspension Take 30 mLs by mouth every 6 (six) hours as needed for indigestion.   Yes Historical Provider, MD  Ibuprofen (MIDOL PO) Take 2 tablets by mouth daily as needed (pain).   Yes Historical Provider, MD  Multiple Vitamin (MULTIVITAMIN WITH MINERALS) TABS tablet Take 1 tablet by mouth daily.   Yes Historical Provider, MD  oxyCODONE-acetaminophen (PERCOCET) 10-325 MG tablet Take 1 tablet by mouth every 4 (four) hours as needed for pain. 01/08/15  Yes Donita Brooks, MD  Pseudoeph-Doxylamine-DM-APAP (NYQUIL PO) Take 1 Dose by mouth at bedtime as needed (cold symptoms).   Yes Historical Provider, MD  tamsulosin (FLOMAX) 0.4 MG CAPS capsule Take 1 capsule (0.4 mg total) by mouth daily. 01/08/15  Yes Donita BrooksWarren T Pickard, MD  naproxen (NAPROSYN) 500 MG tablet Take 1 tablet (500 mg total) by mouth 2 (two) times daily. Patient not taking: Reported on 01/08/2015 01/08/15   Antony MaduraKelly Humes, PA-C   BP 124/63 mmHg  Pulse 105  Temp(Src) 99.4 F (37.4 C) (Oral)  Resp 18  SpO2 100%  LMP 01/03/2015 Physical Exam  Constitutional: She appears well-developed and well-nourished. No distress.  HENT:  Head: Normocephalic and atraumatic.  Eyes: Conjunctivae are normal. Right eye exhibits no discharge. Left eye  exhibits no discharge. No scleral icterus.  Neck: Normal range of motion.  Cardiovascular: Normal rate, regular rhythm and normal heart sounds.   Pulmonary/Chest: Effort normal and breath sounds normal. No respiratory distress.  Abdominal: Soft. Normal appearance. Bowel sounds are decreased. There is tenderness in the left lower quadrant. There is guarding (voluntary). There is no rigidity, no rebound and no CVA tenderness.  Abdomen is soft with tenderness in LLQ. Mild voluntary guarding without rebound or rigidity.   Musculoskeletal: Normal range of motion.  Neurological: She is alert. Coordination normal.  Skin: Skin is warm and dry.  Psychiatric: She has a normal mood and affect. Her behavior is normal.  Nursing note and vitals reviewed.   ED Course  Procedures (including critical care time) Labs Review Labs Reviewed  URINALYSIS, ROUTINE W REFLEX MICROSCOPIC (NOT AT [facility])  POC URINE PREG, ED    Imaging Review Dg Abd 2 Views  01/10/2015  CLINICAL DATA:  Lower abdominal pain for several days EXAM: ABDOMEN - 2 VIEW COMPARISON:  CT abdomen and pelvis January 08, 2015 FINDINGS: Supine and upright images obtained. There is moderate stool throughout the colon. There is no bowel dilatation or air-fluid level suggesting obstruction. No free air. Calcifications in the pelvis are most consistent with phleboliths. The lung bases are clear. IMPRESSION: Moderate stool throughout colon. No obstruction or free air. Pelvic calcifications appear consistent with phleboliths. Lung bases clear. Electronically Signed   By: Bretta BangWilliam  Woodruff III M.D.   On: 01/10/2015 14:59   I have personally reviewed and evaluated these images and lab results as part of my medical decision-making.   EKG Interpretation None      MDM   Final diagnoses:  Constipation, unspecified constipation type  Left lower quadrant pain   45 year old female presenting with LLQ abdominal pain and constipation x 6 days. VSS. Mildly  tachycardic to 105. Patient is nontoxic, nonseptic appearing, in no apparent distress. Abdomen is soft with tenderness in LLQ with voluntary guarding. No peritoneal signs suggesting surgical abdomen. Abd DG shows moderate stool in the colon. Fleet enema given in ED with improvement in abdominal pain after BM. Constipation likely source of her symptoms. Pt had negative abd/pelv CT and pelvic US 3 days ago; no indication to repeat imaging at this time. Patient does not meet the SIRS or Sepsis criteria. Repeat abdominal exam before discharge with improvement in tenderness and without peritoneal signs. Patient discharged home with symptomatic treatment including miralax and given strict instructions for follow-up with their primary care physician. I have also discussed reasons to return immediately to the ER.  Patient expresses understanding and agrees with plan.      Rolm GalaStevi Jodene Polyak, PA-C 01/10/15 1711  Lorre NickAnthony Allen, MD 01/10/15 2325

## 2015-01-10 NOTE — ED Provider Notes (Signed)
Medical screening examination/treatment/procedure(s) were conducted as a shared visit with non-physician practitioner(s) and myself.  I personally evaluated the patient during the encounter.   EKG Interpretation None     Patient here for planning of diffuse abdominal pain and was seen by her primary care Dr. for similar symptoms today. Review of the old records from 2 days ago shows patient had a negative CT scan as well as pelvic ultrasound. Was given an enema here feels better after acute abdominal series showed moderate stool. On my exam of her abdomen, and is nonsurgical. No guarding or rebound. Patient stable for discharge with strict return precautions  Lorre NickAnthony Garrett Bowring, MD 01/10/15 1659

## 2015-01-10 NOTE — ED Notes (Signed)
Per pt, states left lower abdominall pain radiating to left back-has been worked up by other ED, UC and PCP-sent here for kidney stone work up-sates no BM since Sunday

## 2015-01-10 NOTE — ED Notes (Signed)
Informed patient that we need a urine prior to testing

## 2015-01-10 NOTE — Discharge Instructions (Signed)
BUY MIRALAX FROM A PHARMACY. TAKE 6 CAPFULS OF MIRALAX IN A 32 OUNCE GATORADE AND DRINK THE WHOLE BEVERAGE FOLLOWED BY 3 CAPFULS TWICE A DAY FOR THE NEXT WEEK AND FOLLOW UP WITH YOUR PRIMARY CARE PHYSICIAN.   Abdominal Pain, Adult Many things can cause abdominal pain. Usually, abdominal pain is not caused by a disease and will improve without treatment. It can often be observed and treated at home. Your health care provider will do a physical exam and possibly order blood tests and X-rays to help determine the seriousness of your pain. However, in many cases, more time must pass before a clear cause of the pain can be found. Before that point, your health care provider may not know if you need more testing or further treatment. HOME CARE INSTRUCTIONS Monitor your abdominal pain for any changes. The following actions may help to alleviate any discomfort you are experiencing:  Only take over-the-counter or prescription medicines as directed by your health care provider.  Do not take laxatives unless directed to do so by your health care provider.  Try a clear liquid diet (broth, tea, or water) as directed by your health care provider. Slowly move to a bland diet as tolerated. SEEK MEDICAL CARE IF:  You have unexplained abdominal pain.  You have abdominal pain associated with nausea or diarrhea.  You have pain when you urinate or have a bowel movement.  You experience abdominal pain that wakes you in the night.  You have abdominal pain that is worsened or improved by eating food.  You have abdominal pain that is worsened with eating fatty foods.  You have a fever. SEEK IMMEDIATE MEDICAL CARE IF:  Your pain does not go away within 2 hours.  You keep throwing up (vomiting).  Your pain is felt only in portions of the abdomen, such as the right side or the left lower portion of the abdomen.  You pass bloody or black tarry stools. MAKE SURE YOU:  Understand these instructions.  Will  watch your condition.  Will get help right away if you are not doing well or get worse.   This information is not intended to replace advice given to you by your health care provider. Make sure you discuss any questions you have with your health care provider.   Document Released: 10/01/2004 Document Revised: 09/12/2014 Document Reviewed: 08/31/2012 Elsevier Interactive Patient Education 2016 [employer]  Constipation, Adult Constipation is when a person has fewer than three bowel movements a week, has difficulty having a bowel movement, or has stools that are dry, hard, or larger than normal. As people grow older, constipation is more common. A low-fiber diet, not taking in enough fluids, and taking certain medicines may make constipation worse.  CAUSES   Certain medicines, such as antidepressants, pain medicine, iron supplements, antacids, and water pills.   Certain diseases, such as diabetes, irritable bowel syndrome (IBS), thyroid disease, or depression.   Not drinking enough water.   Not eating enough fiber-rich foods.   Stress or travel.   Lack of physical activity or exercise.   Ignoring the urge to have a bowel movement.   Using laxatives too much.  SIGNS AND SYMPTOMS   Having fewer than three bowel movements a week.   Straining to have a bowel movement.   Having stools that are hard, dry, or larger than normal.   Feeling full or bloated.   Pain in the lower abdomen.   Not feeling relief after having a bowel movement.  DIAGNOSIS  Your health care provider will take a medical history and perform a physical exam. Further testing may be done for severe constipation. Some tests may include:  A barium enema X-ray to examine your rectum, colon, and, sometimes, your small intestine.   A sigmoidoscopy to examine your lower colon.   A colonoscopy to examine your entire colon. TREATMENT  Treatment will depend on the severity of your constipation and  what is causing it. Some dietary treatments include drinking more fluids and eating more fiber-rich foods. Lifestyle treatments may include regular exercise. If these diet and lifestyle recommendations do not help, your health care provider may recommend taking over-the-counter laxative medicines to help you have bowel movements. Prescription medicines may be prescribed if over-the-counter medicines do not work.  HOME CARE INSTRUCTIONS   Eat foods that have a lot of fiber, such as fruits, vegetables, whole grains, and beans.  Limit foods high in fat and processed sugars, such as french fries, hamburgers, cookies, candies, and soda.   A fiber supplement may be added to your diet if you cannot get enough fiber from foods.   Drink enough fluids to keep your urine clear or pale yellow.   Exercise regularly or as directed by your health care provider.   Go to the restroom when you have the urge to go. Do not hold it.   Only take over-the-counter or prescription medicines as directed by your health care provider. Do not take other medicines for constipation without talking to your health care provider first.  SEEK IMMEDIATE MEDICAL CARE IF:   You have bright red blood in your stool.   Your constipation lasts for more than 4 days or gets worse.   You have abdominal or rectal pain.   You have thin, pencil-like stools.   You have unexplained weight loss. MAKE SURE YOU:   Understand these instructions.  Will watch your condition.  Will get help right away if you are not doing well or get worse.   This information is not intended to replace advice given to you by your health care provider. Make sure you discuss any questions you have with your health care provider.   Document Released: 09/20/2003 Document Revised: 01/12/2014 Document Reviewed: 10/03/2012 Elsevier Interactive Patient Education [employer]

## 2015-01-10 NOTE — Progress Notes (Signed)
Subjective:    Patient ID: Tammy Melton, female    DOB: 03/04/1970, 45 y.o.   MRN: [id number]  HPI  01/08/15 Was seen in the ER yesterday for abd/pelvic pain in LLQ.  Ct of abd and pelvis along with pelvic US were negative for any acute process.  She is here for follow up.  States the symptoms began on Saturday. Symptoms were mild left lower quadrant abdominal pain. Sunday they intensified. The abdominal pain is colicky in nature it comes and goes similar to menstrual cramps. However it is much more intense. Starting yesterday the pain began to radiate into her lower back. She  did have trace hematuria. She denies any dysuria. She denies any fevers or chills. She denies any nausea or vomiting. She denies any blood in her stool. Last bowel movement was Sunday. However she is having normal flatus. Food does not exacerbate the pain. There are no exacerbating or alleviating factors. The pain seems to come and go at will.  At that time, my plan was: UA shows trace blood.  Symptoms and history and behavior suggest kidney stone which was possibly obscurred on contrasted CT scan.  No sign or symptom of infection, obstruction, or ovarian torsion.  I will treat empirically as nephrolithiasis with toradol 60 mg im x1, percocet 10/325 poq4 hr prn and flomax 0.4 mg poqday.  Recheck Friday or immediately if worsening or changing.    01/10/15 Her pain has intensified dramatically.   She has not had a bowel movement in 4 days.  Pain is instantly worse as soon as she eats now.  She also reports nausea and vomiting.  She now has intense left lower quadrant abdominal pain. She is guarding on exam with rebound. She screams with minimal palpation of the left lower quadrant. She has absent bowel sounds. Clinically she has a presentation of an acute abdomen Recent Results (from the past 2160 hour(s))  GC/Chlamydia probe amp ([place])not at [facility]     Status: None   Collection Time: 01/07/15 12:00 AM  Result Value Ref  Range   Chlamydia Negative     Comment: Normal Reference Range - Negative   Neisseria gonorrhea Negative     Comment: Normal Reference Range - Negative  POCT urinalysis dip (device)     Status: Abnormal   Collection Time: 01/07/15  7:26 PM  Result Value Ref Range   Glucose, UA NEGATIVE NEGATIVE mg/dL   Bilirubin Urine NEGATIVE NEGATIVE   Ketones, ur NEGATIVE NEGATIVE mg/dL   Specific Gravity, Urine >=1.030 1.005 - 1.030   Hgb urine dipstick SMALL (A) NEGATIVE   pH 5.5 5.0 - 8.0   Protein, ur NEGATIVE NEGATIVE mg/dL   Urobilinogen, UA 0.2 0.0 - 1.0 mg/dL   Nitrite NEGATIVE NEGATIVE   Leukocytes, UA NEGATIVE NEGATIVE    Comment: Biochemical Testing Only. Please order routine urinalysis from main lab if confirmatory testing is needed.  Pregnancy, urine POC     Status: None   Collection Time: 01/07/15  7:50 PM  Result Value Ref Range   Preg Test, Ur NEGATIVE NEGATIVE    Comment:        THE SENSITIVITY OF THIS METHODOLOGY IS >24 mIU/mL   Comprehensive metabolic panel     Status: Abnormal   Collection Time: 01/07/15  8:34 PM  Result Value Ref Range   Sodium 140 135 - 145 mmol/L   Potassium 3.7 3.5 - 5.1 mmol/L   Chloride 106 101 - 111 mmol/L   CO2 25  22 - 32 mmol/L   Glucose, Bld 104 (H) 65 - 99 mg/dL   BUN 11 6 - 20 mg/dL   Creatinine, Ser 0.79 0.44 - 1.00 mg/dL   Calcium 9.8 8.9 - 10.3 mg/dL   Total Protein 7.6 6.5 - 8.1 g/dL   Albumin 4.2 3.5 - 5.0 g/dL   AST 16 15 - 41 U/L   ALT 14 14 - 54 U/L   Alkaline Phosphatase 46 38 - 126 U/L   Total Bilirubin 0.4 0.3 - 1.2 mg/dL   GFR calc non Af Amer >60 >60 mL/min   GFR calc Af Amer >60 >60 mL/min    Comment: (NOTE) The eGFR has been calculated using the CKD EPI equation. This calculation has not been validated in all clinical situations. eGFR's persistently <60 mL/min signify possible Chronic Kidney Disease.    Anion gap 9 5 - 15  CBC     Status: Abnormal   Collection Time: 01/07/15  8:34 PM  Result Value Ref Range    WBC 7.6 4.0 - 10.5 K/uL   RBC 4.71 3.87 - 5.11 MIL/uL   Hemoglobin 10.7 (L) 12.0 - 15.0 g/dL   HCT 34.4 (L) 36.0 - 46.0 %   MCV 73.0 (L) 78.0 - 100.0 fL   MCH 22.7 (L) 26.0 - 34.0 pg   MCHC 31.1 30.0 - 36.0 g/dL   RDW 18.3 (H) 11.5 - 15.5 %   Platelets 345 150 - 400 K/uL  I-Stat beta hCG blood, ED ([facility], [facility], [facility] only)     Status: None   Collection Time: 01/07/15  8:44 PM  Result Value Ref Range   I-stat hCG, quantitative <5.0 <5 mIU/mL   Comment 3            Comment:   GEST. AGE      CONC.  (mIU/mL)   <=1 WEEK        5 - 50     2 WEEKS       50 - 500     3 WEEKS       100 - 10,000     4 WEEKS     1,000 - 30,000        FEMALE AND NON-PREGNANT FEMALE:     LESS THAN 5 mIU/mL   Wet prep, genital     Status: Abnormal   Collection Time: 01/07/15 10:45 PM  Result Value Ref Range   Yeast Wet Prep HPF POC NONE SEEN NONE SEEN   Trich, Wet Prep NONE SEEN NONE SEEN   Clue Cells Wet Prep HPF POC NONE SEEN NONE SEEN   WBC, Wet Prep HPF POC MANY (A) NONE SEEN   Sperm NONE SEEN   Urinalysis, Routine w reflex microscopic (not at [facility])     Status: Abnormal   Collection Time: 01/08/15 12:28 PM  Result Value Ref Range   Color, Urine YELLOW YELLOW    Comment: ** Please note change in unit of measure and reference range(s). **      APPearance CLEAR CLEAR   Specific Gravity, Urine 1.025 1.001 - 1.035   pH 6.0 5.0 - 8.0   Glucose, UA NEGATIVE NEGATIVE   Bilirubin Urine NEGATIVE NEGATIVE   Ketones, ur 1+ (A) NEGATIVE   Hgb urine dipstick TRACE (A) NEGATIVE   Protein, ur NEGATIVE NEGATIVE   Nitrite NEGATIVE NEGATIVE   Leukocytes, UA NEGATIVE NEGATIVE  Urine Microscopic     Status: Abnormal   Collection Time: 01/08/15 12:28 PM  Result Value  Ref Range   WBC, UA 0-5 <=5 WBC/HPF   RBC / HPF 0-2 <=2 RBC/HPF   Squamous Epithelial / LPF 0-5 <=5 HPF   Bacteria, UA FEW (A) NONE SEEN HPF   Crystals NONE SEEN NONE SEEN HPF   Casts NONE SEEN NONE SEEN LPF   Yeast NONE SEEN NONE SEEN HPF   Urine-Other  MUCUS SEEN     Past Medical History  Diagnosis Date  . High blood pressure     h/o  . Migraines   . Anemia   . Depression    Past Surgical History  Procedure Laterality Date  . Tubal ligation    . Cesarean section     Current Outpatient Prescriptions on File Prior to Visit  Medication Sig Dispense Refill  . naproxen (NAPROSYN) 500 MG tablet Take 1 tablet (500 mg total) by mouth 2 (two) times daily. (Patient not taking: Reported on 01/08/2015) 30 tablet 0  . oxyCODONE-acetaminophen (PERCOCET) 10-325 MG tablet Take 1 tablet by mouth every 4 (four) hours as needed for pain. 30 tablet 0  . tamsulosin (FLOMAX) 0.4 MG CAPS capsule Take 1 capsule (0.4 mg total) by mouth daily. 30 capsule 0   No current facility-administered medications on file prior to visit.   No Known Allergies Social History   Social History  . Marital Status: Married    Spouse Name: N/A  . Number of Children: N/A  . Years of Education: N/A   Occupational History  . Not on file.   Social History Main Topics  . Smoking status: Never Smoker   . Smokeless tobacco: Not on file  . Alcohol Use: No  . Drug Use: No  . Sexual Activity: Not on file   Other Topics Concern  . Not on file   Social History Narrative     Review of Systems  All other systems reviewed and are negative.      Objective:   Physical Exam  Constitutional: She appears well-developed and well-nourished. She appears distressed.  HENT:  Head: Normocephalic.  Right Ear: External ear normal.  Left Ear: External ear normal.  Nose: Nose normal.  Cardiovascular: Normal rate, regular rhythm and normal heart sounds.  Exam reveals no gallop and no friction rub.   No murmur heard. Pulmonary/Chest: Effort normal and breath sounds normal. No respiratory distress. She has no wheezes. She has no rales.  Abdominal: She exhibits no distension and no mass. Bowel sounds are absent. There is tenderness. There is rebound and guarding.  Skin: She is  not diaphoretic.  Vitals reviewed.        Assessment & Plan:   Acute abdominal pain in left lower quadrant  Patient appears to have the classic presentation for an acute abdomen. Needs urgent evaluation in the emergency room and repeat CT scan versus surgical consultation.  We will notify the triage nurse at [facility] [employer] where she is going.

## 2015-05-02 ENCOUNTER — Encounter: Payer: Self-pay | Admitting: Physician Assistant

## 2015-05-02 ENCOUNTER — Ambulatory Visit (INDEPENDENT_AMBULATORY_CARE_PROVIDER_SITE_OTHER): Admitting: Physician Assistant

## 2015-05-02 ENCOUNTER — Encounter: Payer: Self-pay | Admitting: Family Medicine

## 2015-05-02 VITALS — BP 124/86 | HR 96 | Temp 98.8°F | Resp 18 | Wt 127.0 lb

## 2015-05-02 DIAGNOSIS — R1032 Left lower quadrant pain: Secondary | ICD-10-CM

## 2015-05-02 LAB — URINALYSIS, MICROSCOPIC ONLY
Bacteria, UA: NONE SEEN [HPF]
CASTS: NONE SEEN [LPF]
Crystals: NONE SEEN [HPF]
Squamous Epithelial / LPF: NONE SEEN [HPF] (ref <=5)
YEAST: NONE SEEN [HPF]

## 2015-05-02 LAB — CBC
HEMATOCRIT: 36.8 % (ref 35.0–45.0)
Hemoglobin: 11 g/dL — ABNORMAL LOW (ref 12.0–15.0)
MCH: 22 pg — AB (ref 27.0–33.0)
MCHC: 29.9 g/dL — AB (ref 32.0–36.0)
MCV: 73.7 fL — AB (ref 80.0–100.0)
MPV: 10.9 fL (ref 7.5–12.5)
Platelets: 369 10*3/uL (ref 140–400)
RBC: 4.99 MIL/uL (ref 3.80–5.10)
RDW: 20.1 % — AB (ref 11.0–15.0)
WBC: 4.8 10*3/uL (ref 3.8–10.8)

## 2015-05-03 ENCOUNTER — Ambulatory Visit: Admitting: Family Medicine

## 2015-05-03 NOTE — Progress Notes (Signed)
Patient ID: Tammy Melton MRN: [id number], DOB: 06-10-1970, 45 y.o. Date of Encounter: @  Chief Complaint:  Chief Complaint  Patient presents with  . c/o LLQ pain x 4 days    constant, has been having after menses but this month much worse and not going away    HPI: 45 y.o. year oldAA female  presents with her husband for OV with c/o above.   I reviewed the following records: 01/07/2015---went to [facility], c/o LLQ abdominal pain. They transferred her to ER 01/07/2015--ER--  U/S----Normal  CT Abdomen/Pelvis---Negative 01/08/15--F/U OV with Dr. Daiva Eves for possible kidney stone 01/10/2015---ER--XRay with moderate stool in colon. Fleet enema given in ED with improvement in abdominal pain. Discharged home with instructions to use Mirilax.   Today pt reports that abdominal ain she has been experiencing with this episode feels exactly the same as it felt at the prior episodes above. Says this episode started Monday 04/30/15. During that night, pain worsened. Was better on Tuesday and Wednesday. Increased again this morning.  Says that when she has BM she does not have a lot of straining, and stool is not really hard. Says she usually has BM about twice a day. Has had no change in this.  Asked about Mirilax---says she used Mirilax once in February because the pain came back. Pain resolved after using mirilax.  That is the only time she has used Mirilax. Still has Mirilax at home.  Has had no fever/chills. No dysuria. No vaginal discharge or irritation.    Past Medical History  Diagnosis Date  . High blood pressure     h/o  . Migraines   . Anemia   . Depression      Home Meds: Outpatient Prescriptions Prior to Visit  Medication Sig Dispense Refill  . aspirin-acetaminophen-caffeine (EXCEDRIN MIGRAINE) 250-250-65 MG tablet Take 2 tablets by mouth 2 (two) times daily as needed for headache.    . bismuth subsalicylate (PEPTO BISMOL) 262 MG/15ML suspension Take 30 mLs by mouth  every 6 (six) hours as needed for indigestion.    . Ibuprofen (MIDOL PO) Take 2 tablets by mouth daily as needed (pain).    . Multiple Vitamin (MULTIVITAMIN WITH MINERALS) TABS tablet Take 1 tablet by mouth daily.    . Pseudoeph-Doxylamine-DM-APAP (NYQUIL PO) Take 1 Dose by mouth at bedtime as needed (cold symptoms).    . naproxen (NAPROSYN) 500 MG tablet Take 1 tablet (500 mg total) by mouth 2 (two) times daily. (Patient not taking: Reported on 01/08/2015) 30 tablet 0  . oxyCODONE-acetaminophen (PERCOCET) 10-325 MG tablet Take 1 tablet by mouth every 4 (four) hours as needed for pain. (Patient not taking: Reported on 05/02/2015) 30 tablet 0  . tamsulosin (FLOMAX) 0.4 MG CAPS capsule Take 1 capsule (0.4 mg total) by mouth daily. (Patient not taking: Reported on 05/02/2015) 30 capsule 0   No facility-administered medications prior to visit.    Allergies: No Known Allergies  Social History   Social History  . Marital Status: Married    Spouse Name: N/A  . Number of Children: N/A  . Years of Education: N/A   Occupational History  . Not on file.   Social History Main Topics  . Smoking status: Never Smoker   . Smokeless tobacco: Not on file  . Alcohol Use: No  . Drug Use: No  . Sexual Activity: Not on file   Other Topics Concern  . Not on file   Social History Narrative    No family history  on file.   Review of Systems:  See HPI for pertinent ROS. All other ROS negative.    Physical Exam: Blood pressure 124/86, pulse 96, temperature 98.8 F (37.1 C), temperature source Oral, resp. rate 18, weight 127 lb (57.607 kg)., Body mass index is 21.79 kg/(m^2). General: WNWD AAF. Appears in no acute distress. Neck: Supple. No thyromegaly. No lymphadenopathy. Lungs: Clear bilaterally to auscultation without wheezes, rales, or rhonchi. Breathing is unlabored. Heart: RRR with S1 S2. No murmurs, rubs, or gallops. Abdomen: Soft, non-tender, non-distended with normoactive bowel sounds. No  hepatomegaly. No rebound/guarding. No obvious abdominal masses. I firmly palpated abdomen and there is NO area of tenderness a this time. No tenderness in LLQ.  Musculoskeletal:  Strength and tone normal for age. Extremities/Skin: Warm and dry.  Neuro: Alert and oriented X 3. Moves all extremities spontaneously. Gait is normal. CNII-XII grossly in tact. Psych:  Responds to questions appropriately with a normal affect.   Results for orders placed or performed in visit on 05/02/15  Urinalysis, Routine w reflex microscopic (not at [facility])  Result Value Ref Range   Color, Urine  YELLOW   APPearance  CLEAR   Specific Gravity, Urine  1.001 - 1.035   pH  5.0 - 8.0   Glucose, UA  NEGATIVE   Bilirubin Urine  NEGATIVE   Ketones, ur  NEGATIVE   Hgb urine dipstick  NEGATIVE   Protein, ur  NEGATIVE   Nitrite  NEGATIVE   Leukocytes, UA  NEGATIVE  CBC  Result Value Ref Range   WBC 4.8 3.8 - 10.8 K/uL   RBC 4.99 3.80 - 5.10 MIL/uL   Hemoglobin 11.0 (L) 12.0 - 15.0 g/dL   HCT 91.436.8 78.235.0 - 95.645.0 %   MCV 73.7 (L) 80.0 - 100.0 fL   MCH 22.0 (L) 27.0 - 33.0 pg   MCHC 29.9 (L) 32.0 - 36.0 g/dL   RDW 21.320.1 (H) 08.611.0 - 57.815.0 %   Platelets 369 140 - 400 K/uL   MPV 10.9 7.5 - 12.5 fL  Urine Microscopic  Result Value Ref Range   WBC, UA 0-5 <=5 WBC/HPF   RBC / HPF 3-10 (A) <=2 RBC/HPF   Squamous Epithelial / LPF NONE SEEN <=5 HPF   Bacteria, UA NONE SEEN NONE SEEN HPF   Crystals NONE SEEN NONE SEEN HPF   Casts NONE SEEN NONE SEEN LPF   Yeast NONE SEEN NONE SEEN HPF     ASSESSMENT AND PLAN:  45 y.o. year old female with  1. Abdominal pain, left lower quadrant  - Urinalysis, Routine w reflex microscopic (not at [facility]) - CBC  UA and CBC were run while pt in office and reviewed result with her and her husband.  UA negative. WBC normal--no indication of bacterial infection.  Anemia stable.  Instructed her to go home and take 2 capfuls of mirilax. Drink lots of water. Use Mirilax PRN in future. Will  not add Linzess at this time as she says her stools are soft with no straining and has BM BID.   [id number], Mary Beth SiloamDixon, [place], [facility] 05/03/2015 8:39 AM

## 2015-05-06 ENCOUNTER — Encounter: Payer: Self-pay | Admitting: Family Medicine

## 2015-05-06 ENCOUNTER — Ambulatory Visit (INDEPENDENT_AMBULATORY_CARE_PROVIDER_SITE_OTHER): Admitting: Family Medicine

## 2015-05-06 VITALS — BP 118/68 | HR 88 | Temp 98.1°F | Resp 16 | Wt 129.0 lb

## 2015-05-06 DIAGNOSIS — N809 Endometriosis, unspecified: Secondary | ICD-10-CM

## 2015-05-06 MED ORDER — KETOROLAC TROMETHAMINE 60 MG/2ML IM SOLN
60.0000 mg | Freq: Once | INTRAMUSCULAR | Status: AC
  Administered 2015-05-06: 60 mg via INTRAMUSCULAR

## 2015-05-06 MED ORDER — HYDROCODONE-ACETAMINOPHEN 5-325 MG PO TABS: 1.0000 | ORAL_TABLET | Freq: Four times a day (QID) | ORAL | Status: DC | PRN

## 2015-05-06 MED ORDER — LEVONORGEST-ETH ESTRAD 91-DAY 0.15-0.03 MG PO TABS: 1.0000 | ORAL_TABLET | Freq: Every day | ORAL | Status: DC

## 2015-05-06 NOTE — Addendum Note (Signed)
Addended by: Legrand RamsWILLIS, Chelby Salata B on: 05/06/2015 03:18 PM   Modules accepted: Orders

## 2015-05-06 NOTE — Progress Notes (Signed)
Subjective:    Patient ID: Tammy Melton, female    DOB: [birth year], 45 y.o.   MRN: [id number]  HPI  01/08/15 Was seen in the ER yesterday for abd/pelvic pain in LLQ.  Ct of abd and pelvis along with pelvic us were negative for any acute process.  She is here for follow up.  States the symptoms began on Saturday. Symptoms were mild left lower quadrant abdominal pain. Sunday they intensified. The abdominal pain is colicky in nature it comes and goes similar to menstrual cramps. However it is much more intense. Starting yesterday the pain began to radiate into her lower back. She  did have trace hematuria. She denies any dysuria. She denies any fevers or chills. She denies any nausea or vomiting. She denies any blood in her stool. Last bowel movement was Sunday. However she is having normal flatus. Food does not exacerbate the pain. There are no exacerbating or alleviating factors. The pain seems to come and go at will.  At that time, my plan was: UA shows trace blood.  Symptoms and history and behavior suggest kidney stone which was possibly obscurred on contrasted CT scan.  No sign or symptom of infection, obstruction, or ovarian torsion.  I will treat empirically as nephrolithiasis with toradol 60 mg im x1, percocet 10/325 poq4 hr prn and flomax 0.4 mg poqday.  Recheck Friday or immediately if worsening or changing.    01/10/15 Her pain has intensified dramatically.   She has not had a bowel movement in 4 days.  Pain is instantly worse as soon as she eats now.  She also reports nausea and vomiting.  She now has intense left lower quadrant abdominal pain. She is guarding on exam with rebound. She screams with minimal palpation of the left lower quadrant. She has absent bowel sounds. Clinically she has a presentation of an acute abdomen.  At that time, my plan was: Patient appears to have the classic presentation for an acute abdomen. Needs urgent evaluation in the emergency room and repeat CT scan  versus surgical consultation.  [facility].    05/06/15 Ultimately, was treated in the emergency room for constipation with a fleets enema and her pain gradually improved also was given pain medication. That was the end of that episode of her left lower quadrant pain. Ironically it also happened again in February and again in March it always occurs around the time of her menstrual cycle. This month that occurred in a severe patient is seen in this clinic last week and was given MiraLAX for constipation since this was assumed to be the cause of the pain in January. The MiraLAX has not helped. The patient states that she is not constipated. She is having 2-3 bowel movements a day which are soft without straining. The pain is throbbing in nature. She denies any vaginal discharge. She denies any hematuria. She denies any fevers or chills or diarrhea. She denies any symptoms of diverticulitis. She denies any blood in her stool or black tarry stool Past Medical History  Diagnosis Date  . High blood pressure     h/o  . Migraines   . Anemia   . Depression    Past Surgical History  Procedure Laterality Date  . Tubal ligation    . Cesarean section     Current Outpatient Prescriptions on File Prior to Visit  Medication Sig Dispense Refill  . aspirin-acetaminophen-caffeine (EXCEDRIN MIGRAINE)  250-250-65 MG tablet Take 2 tablets by mouth 2 (two) times daily as needed for headache.    . Ibuprofen (MIDOL PO) Take 2 tablets by mouth daily as needed (pain).    . Multiple Vitamin (MULTIVITAMIN WITH MINERALS) TABS tablet Take 1 tablet by mouth daily.    . Pseudoeph-Doxylamine-DM-APAP (NYQUIL PO) Take 1 Dose by mouth at bedtime as needed (cold symptoms).     No current facility-administered medications on file prior to visit.   No Known Allergies Social History   Social History  . Marital Status: Married    Spouse Name: N/A  . Number of Children: N/A   . Years of Education: N/A   Occupational History  . Not on file.   Social History Main Topics  . Smoking status: Never Smoker   . Smokeless tobacco: Not on file  . Alcohol Use: No  . Drug Use: No  . Sexual Activity: Not on file   Other Topics Concern  . Not on file   Social History Narrative     Review of Systems  All other systems reviewed and are negative.      Objective:   Physical Exam  Constitutional: She appears well-developed and well-nourished. She appears distressed.  HENT:  Head: Normocephalic.  Right Ear: External ear normal.  Left Ear: External ear normal.  Nose: Nose normal.  Cardiovascular: Normal rate, regular rhythm and normal heart sounds.  Exam reveals no gallop and no friction rub.   No murmur heard. Pulmonary/Chest: Effort normal and breath sounds normal. No respiratory distress. She has no wheezes. She has no rales.  Abdominal: She exhibits no distension and no mass. Bowel sounds are absent. There is tenderness. There is no rebound and no guarding.  Skin: She is not diaphoretic.  Vitals reviewed.        Assessment & Plan:   Endometriosis - Plan: levonorgestrel-ethinyl estradiol (SEASONALE,INTROVALE,JOLESSA) 0.15-0.03 MG tablet, HYDROcodone-acetaminophen (NORCO) 5-325 MG tablet, Ambulatory referral to Gynecology  This pain is occurring every month in a cyclic nature that seems to be hormonal in nature. Therefore I'm concerned that she may have endometriosis. She is already had a CT scan as well as a pelvic ultrasound that were unremarkable. Options include a GI consultation for colonoscopy to rule out diverticulosis/diverticulitis however her history and clinical presentation are not consistent with this. I believe she is experiencing endometriosis and therefore I recommended a GYN consultation. Meanwhile I will start the patient on continuous OC pills/Seasonale 1 by mouth daily to try to help prevent and manage the pain in the future. I will give  her Norco 5/325 one to 2 every 6 hours as needed for pain. Also gave her shot of Toradol 60 mg IM 1 now.  Recheck immediately should symptoms worsen.

## 2015-07-12 LAB — URINALYSIS, ROUTINE W REFLEX MICROSCOPIC
BILIRUBIN URINE: NEGATIVE
GLUCOSE, UA: NEGATIVE
KETONES UR: NEGATIVE
Leukocytes, UA: NEGATIVE
Nitrite: NEGATIVE
PH: 8.5 — AB (ref 5.0–8.0)
Protein, ur: NEGATIVE
SPECIFIC GRAVITY, URINE: 1.02 (ref 1.001–1.035)

## 2015-10-07 ENCOUNTER — Encounter: Payer: Self-pay | Admitting: Physician Assistant

## 2015-10-07 ENCOUNTER — Ambulatory Visit (INDEPENDENT_AMBULATORY_CARE_PROVIDER_SITE_OTHER): Admitting: Physician Assistant

## 2015-10-07 VITALS — BP 118/82 | HR 95 | Temp 98.0°F | Resp 16 | Wt 132.0 lb

## 2015-10-07 DIAGNOSIS — Z111 Encounter for screening for respiratory tuberculosis: Secondary | ICD-10-CM

## 2015-10-07 DIAGNOSIS — R7611 Nonspecific reaction to tuberculin skin test without active tuberculosis: Secondary | ICD-10-CM | POA: Diagnosis not present

## 2015-10-07 DIAGNOSIS — Z0289 Encounter for other administrative examinations: Secondary | ICD-10-CM | POA: Diagnosis not present

## 2015-10-07 DIAGNOSIS — Z227 Latent tuberculosis: Secondary | ICD-10-CM

## 2015-10-07 NOTE — Progress Notes (Signed)
Patient ID: Tammy Melton MRN: [id number], DOB: 1970/06/10, 45 y.o. Date of Encounter: @DATE @  Chief Complaint:  Chief Complaint  Patient presents with  . office visit    HPI: 45 y.o. 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   She states that she does not need a complete physical exam today. She states that she recently had screening lab work performed just a few months ago. She states that she sees a gynecologist for breast exam and pelvic exam and just saw them this summer. 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  The form includes the following information: Does the applicant have any physical condition which would limit their work with children?  if yes please describe.----No she does not Is the applicant currently under treatment which would preclude their work with children? If yes, please describe------ No, she is not on treatment that would preclude her work with children Is this applicant currently under treatment for any specific condition? If yes please describe---------------------------------- No, she is not Is this applicant currently taking any medication that would affect his/her work with children? If yes please describe---No, she is not. In your opinion, is this applicant emotionally and physically capable to care for children on a daily basis?------------------Yes, She is.   Past Medical History:  Diagnosis Date  . Anemia   . Depression   . High blood pressure    h/o  . Migraines      Home Meds: Outpatient Medications Prior to Visit  Medication Sig Dispense Refill  . aspirin-acetaminophen-caffeine (EXCEDRIN MIGRAINE) 250-250-65 MG tablet Take 2 tablets by mouth 2 (two) times daily as needed for headache.    . levonorgestrel-ethinyl estradiol (SEASONALE,INTROVALE,JOLESSA) 0.15-0.03 MG tablet Take 1 tablet by mouth daily.  1 Package 4  . HYDROcodone-acetaminophen (NORCO) 5-325 MG tablet Take 1-2 tablets by mouth every 6 (six) hours as needed for moderate pain. (Patient not taking: Reported on 10/07/2015) 30 tablet 0  . Ibuprofen (MIDOL PO) Take 2 tablets by mouth daily as needed (pain).    . Multiple Vitamin (MULTIVITAMIN WITH MINERALS) TABS tablet Take 1 tablet by mouth daily.    . Pseudoeph-Doxylamine-DM-APAP (NYQUIL PO) Take 1 Dose by mouth at bedtime as needed (cold symptoms).     No facility-administered medications prior to visit.     Allergies: No Known Allergies  Social History   Social History  . Marital status: Married    Spouse name: N/A  . Number of children: N/A  . Years of education: N/A   Occupational History  . Not on file.   Social History Main Topics  . Smoking status: Never Smoker  . Smokeless tobacco: Not on file  . Alcohol use No  . Drug use: No  . Sexual activity: Not on file   Other Topics Concern  . Not on file   Social History Narrative  . No narrative on file    No family history on file.   Review of Systems:  See HPI for pertinent ROS. All other ROS negative.    Physical Exam: Blood pressure 118/82, pulse 95, temperature 98 F (36.7 C), temperature source Oral, resp. rate 16, weight 132 lb (59.9 kg), last menstrual period 05/07/2015., Body mass index is 22.66 kg/m. General: WNWD AAF. Appears in no acute distress. Neck: Supple. No thyromegaly. No lymphadenopathy. Lungs: Clear bilaterally to auscultation  without wheezes, rales, or rhonchi. Breathing is unlabored. Heart: RRR with S1 S2. No murmurs, rubs, or gallops. Abdomen: Soft, non-tender, non-distended with normoactive bowel sounds. No hepatomegaly. No rebound/guarding. No obvious abdominal masses. Musculoskeletal:  Strength and tone normal for age. Extremities/Skin: Warm and dry. Neuro: Alert and oriented X 3. Moves all extremities spontaneously. Gait is normal. CNII-XII grossly in tact. Psych:  Responds  to questions appropriately with a normal affect.     ASSESSMENT AND PLAN:  45 y.o. year old female with  1. Encounter for completion of form with patient The only daily medication she takes is birth control pills. States she is on no other chronic medications. Reviewed her chart. She has had no significant imaging studies or history of orthopedic/musculoskeletal problems that would affect her physically. Also I see no history of psychiatric illness that would affect her working this job. In addition to completing this form, which documents the information above, the only other information she states that she needs is a TB skin test.  This is performed today and she'll return in 48-72 hours for us to read this and document result.   [address], Eboni Coval Beth LockhartDixon, [place], [facility] 10/07/2015 3:19 PM

## 2015-10-09 ENCOUNTER — Ambulatory Visit: Admitting: Family Medicine

## 2015-10-09 LAB — TB SKIN TEST
INDURATION: 0 mm
TB SKIN TEST: NEGATIVE

## 2015-11-16 ENCOUNTER — Other Ambulatory Visit: Payer: Self-pay | Admitting: Family Medicine

## 2015-11-16 DIAGNOSIS — R1032 Left lower quadrant pain: Secondary | ICD-10-CM

## 2016-05-15 ENCOUNTER — Other Ambulatory Visit: Payer: Self-pay | Admitting: Family Medicine

## 2016-05-15 DIAGNOSIS — N809 Endometriosis, unspecified: Secondary | ICD-10-CM

## 2016-08-18 ENCOUNTER — Other Ambulatory Visit: Payer: Self-pay | Admitting: Family Medicine

## 2016-08-18 DIAGNOSIS — N809 Endometriosis, unspecified: Secondary | ICD-10-CM

## 2016-08-20 ENCOUNTER — Telehealth: Payer: Self-pay | Admitting: Family Medicine

## 2016-08-20 DIAGNOSIS — N809 Endometriosis, unspecified: Secondary | ICD-10-CM

## 2016-08-20 NOTE — Telephone Encounter (Signed)
Pt needs refill on quasense, she is now using [facility] on [address].

## 2016-08-21 MED ORDER — LEVONORGEST-ETH ESTRAD 91-DAY 0.15-0.03 MG PO TABS: 1.0000 | ORAL_TABLET | Freq: Every day | ORAL | 3 refills | Status: DC

## 2016-08-21 NOTE — Telephone Encounter (Signed)
Medication called/sent to requested pharmacy  

## 2016-08-25 ENCOUNTER — Encounter: Payer: Self-pay | Admitting: Family Medicine

## 2016-08-25 ENCOUNTER — Ambulatory Visit (INDEPENDENT_AMBULATORY_CARE_PROVIDER_SITE_OTHER): Admitting: Family Medicine

## 2016-08-25 VITALS — BP 142/80 | HR 100 | Temp 98.3°F | Resp 14 | Ht 64.0 in | Wt 138.0 lb

## 2016-08-25 DIAGNOSIS — R030 Elevated blood-pressure reading, without diagnosis of hypertension: Secondary | ICD-10-CM

## 2016-08-25 DIAGNOSIS — N809 Endometriosis, unspecified: Secondary | ICD-10-CM | POA: Diagnosis not present

## 2016-08-25 MED ORDER — LEVONORGEST-ETH ESTRAD 91-DAY 0.15-0.03 MG PO TABS: 1.0000 | ORAL_TABLET | Freq: Every day | ORAL | 3 refills | Status: DC

## 2016-08-25 NOTE — Progress Notes (Signed)
Subjective:    Patient ID: Tammy Melton, female    DOB: 10/06/1970, 46 y.o.   MRN: [id number]  HPI 01/08/15 Was seen in the ER yesterday for abd/pelvic pain in LLQ.  Ct of abd and pelvis along with pelvic US were negative for any acute process.  She is here for follow up.  States the symptoms began on Saturday. Symptoms were mild left lower quadrant abdominal pain. Sunday they intensified. The abdominal pain is colicky in nature it comes and goes similar to menstrual cramps. However it is much more intense. Starting yesterday the pain began to radiate into her lower back. She  did have trace hematuria. She denies any dysuria. She denies any fevers or chills. She denies any nausea or vomiting. She denies any blood in her stool. Last bowel movement was Sunday. However she is having normal flatus. Food does not exacerbate the pain. There are no exacerbating or alleviating factors. The pain seems to come and go at will.  At that time, my plan was: UA shows trace blood.  Symptoms and history and behavior suggest kidney stone which was possibly obscurred on contrasted CT scan.  No sign or symptom of infection, obstruction, or ovarian torsion.  I will treat empirically as nephrolithiasis with toradol 60 mg im x1, percocet 10/325 poq4 hr prn and flomax 0.4 mg poqday.  Recheck Friday or immediately if worsening or changing.    01/10/15 Her pain has intensified dramatically.   She has not had a bowel movement in 4 days.  Pain is instantly worse as soon as she eats now.  She also reports nausea and vomiting.  She now has intense left lower quadrant abdominal pain. She is guarding on exam with rebound. She screams with minimal palpation of the left lower quadrant. She has absent bowel sounds. Clinically she has a presentation of an acute abdomen.  At that time, my plan was: Patient appears to have the classic presentation for an acute abdomen. Needs urgent evaluation in the emergency room and repeat CT scan versus  surgical consultation.  We will notify the triage nurse at [facility] where she is going.    05/06/15 Ultimately, was treated in the emergency room for constipation with a fleets enema and her pain gradually improved also was given pain medication. That was the end of that episode of her left lower quadrant pain. Ironically it also happened again in February and again in March it always occurs around the time of her menstrual cycle. This month that occurred in a severe patient is seen in this clinic last week and was given MiraLAX for constipation since this was assumed to be the cause of the pain in January. The MiraLAX has not helped. The patient states that she is not constipated. She is having 2-3 bowel movements a day which are soft without straining. The pain is throbbing in nature. She denies any vaginal discharge. She denies any hematuria. She denies any fevers or chills or diarrhea. She denies any symptoms of diverticulitis. She denies any blood in her stool or black tarry stool.  At that time, my plan was: This pain is occurring every month in a cyclic nature that seems to be hormonal in nature. Therefore I'm concerned that she may have endometriosis. She is already had a CT scan as well as a pelvic ultrasound that were unremarkable. Options include a GI consultation for colonoscopy to rule out diverticulosis/diverticulitis however her history and clinical presentation are not consistent with this.  I believe she is experiencing endometriosis and therefore I recommended a GYN consultation. Meanwhile I will start the patient on continuous OC pills/Seasonale 1 by mouth daily to try to help prevent and manage the pain in the future. I will give her Norco 5/325 one to 2 every 6 hours as needed for pain. Also gave her shot of Toradol 60 mg IM 1 now.  Recheck immediately should symptoms worsen.  08/25/16 Patient has been pain-free ever since she started birth control. She has done remarkably well over  the last year. However she is very hesitant to discontinue the birth control because she is afraid of the pain returning. She does not like the weight gain however that she is experienced in starting birth control. Her blood pressure today is also mildly elevated. She denies any chest pain shortness of breath or dyspnea on exertion. Past Medical History:  Diagnosis Date  . Anemia   . Depression   . High blood pressure    h/o  . Migraines    Past Surgical History:  Procedure Laterality Date  . CESAREAN SECTION    . TUBAL LIGATION     Current Outpatient Prescriptions on File Prior to Visit  Medication Sig Dispense Refill  . aspirin-acetaminophen-caffeine (EXCEDRIN MIGRAINE) 250-250-65 MG tablet Take 2 tablets by mouth 2 (two) times daily as needed for headache.    Marland Kitchen HYDROcodone-acetaminophen (NORCO) 5-325 MG tablet Take 1-2 tablets by mouth every 6 (six) hours as needed for moderate pain. (Patient not taking: Reported on 10/07/2015) 30 tablet 0  . Ibuprofen (MIDOL PO) Take 2 tablets by mouth daily as needed (pain).    Marland Kitchen levonorgestrel-ethinyl estradiol (QUASENSE) 0.15-0.03 MG tablet Take 1 tablet by mouth daily. 1 Package 3  . Multiple Vitamin (MULTIVITAMIN WITH MINERALS) TABS tablet Take 1 tablet by mouth daily.    . Pseudoeph-Doxylamine-DM-APAP (NYQUIL PO) Take 1 Dose by mouth at bedtime as needed (cold symptoms).    . tamsulosin (FLOMAX) 0.4 MG CAPS capsule TAKE 1 CAPSULE(0.4 MG) BY MOUTH DAILY 30 capsule 0   No current facility-administered medications on file prior to visit.    No Known Allergies Social History   Social History  . Marital status: Married    Spouse name: N/A  . Number of children: N/A  . Years of education: N/A   Occupational History  . Not on file.   Social History Main Topics  . Smoking status: Never Smoker  . Smokeless tobacco: Not on file  . Alcohol use No  . Drug use: No  . Sexual activity: Not on file   Other Topics Concern  . Not on file    Social History Narrative  . No narrative on file     Review of Systems  All other systems reviewed and are negative.      Objective:   Physical Exam  Constitutional: She appears well-developed and well-nourished. No distress.  HENT:  Head: Normocephalic.  Right Ear: External ear normal.  Left Ear: External ear normal.  Nose: Nose normal.  Cardiovascular: Normal rate, regular rhythm and normal heart sounds.  Exam reveals no gallop and no friction rub.   No murmur heard. Pulmonary/Chest: Effort normal and breath sounds normal. No respiratory distress. She has no wheezes. She has no rales.  Abdominal: Bowel sounds are normal. She exhibits no distension and no mass. There is no tenderness. There is no rebound and no guarding.  Skin: She is not diaphoretic.  Vitals reviewed.  Assessment & Plan:  Elevated blood-pressure reading without diagnosis of hypertension - Plan: CBC with Differential/Platelet, COMPLETE METABOLIC PANEL WITH GFR, LDL Cholesterol, Direct  Endometriosis - Plan: levonorgestrel-ethinyl estradiol (QUASENSE) 0.15-0.03 MG tablet, DISCONTINUED: levonorgestrel-ethinyl estradiol (QUASENSE) 0.15-0.03 MG tablet  Pain has been controlled ever since she started birth control pills. We discussed the increased risk of cardiovascular disease, stroke, blood clots, and breast cancer on estrogen. We discussed switching the patient to something like an IUD. Patient would like to consider these options been in the meantime she elects to continue to take the risk and continue on oral contraceptive pills. Her blood pressure today is mildly elevated. She will recheck this several times at home over the next week and notify me of their values. If persistently elevated, we may want to consider getting the patient on medication for hypertension. I will also check some baseline lab work including a CBC, CMP, and direct LDL cholesterol

## 2016-08-26 ENCOUNTER — Encounter: Payer: Self-pay | Admitting: Family Medicine

## 2016-08-26 LAB — CBC WITH DIFFERENTIAL/PLATELET
BASOS ABS: 110 {cells}/uL (ref 0–200)
Basophils Relative: 2 %
Eosinophils Absolute: 55 cells/uL (ref 15–500)
Eosinophils Relative: 1 %
HCT: 43.2 % (ref 35.0–45.0)
HEMOGLOBIN: 13.6 g/dL (ref 12.0–15.0)
LYMPHS ABS: 1430 {cells}/uL (ref 850–3900)
Lymphocytes Relative: 26 %
MCH: 26.7 pg — AB (ref 27.0–33.0)
MCHC: 31.5 g/dL — AB (ref 32.0–36.0)
MCV: 84.9 fL (ref 80.0–100.0)
MONOS PCT: 6 %
MPV: 10.2 fL (ref 7.5–12.5)
Monocytes Absolute: 330 cells/uL (ref 200–950)
NEUTROS ABS: 3575 {cells}/uL (ref 1500–7800)
NEUTROS PCT: 65 %
PLATELETS: 362 10*3/uL (ref 140–400)
RBC: 5.09 MIL/uL (ref 3.80–5.10)
RDW: 16.6 % — ABNORMAL HIGH (ref 11.0–15.0)
WBC: 5.5 10*3/uL (ref 3.8–10.8)

## 2016-08-26 LAB — COMPLETE METABOLIC PANEL WITH GFR
ALT: 13 U/L (ref 6–29)
AST: 15 U/L (ref 10–30)
Albumin: 4.8 g/dL (ref 3.6–5.1)
Alkaline Phosphatase: 46 U/L (ref 33–115)
BUN: 10 mg/dL (ref 7–25)
CHLORIDE: 105 mmol/L (ref 98–110)
CO2: 23 mmol/L (ref 20–32)
Calcium: 9.9 mg/dL (ref 8.6–10.2)
Creat: 0.81 mg/dL (ref 0.50–1.10)
GFR, EST NON AFRICAN AMERICAN: 89 mL/min (ref >=60)
GFR, Est African American: >89 mL/min (ref >=60)
GLUCOSE: 109 mg/dL — AB (ref 70–99)
POTASSIUM: 3.8 mmol/L (ref 3.5–5.3)
SODIUM: 140 mmol/L (ref 135–146)
Total Bilirubin: 0.7 mg/dL (ref 0.2–1.2)
Total Protein: 7.4 g/dL (ref 6.1–8.1)

## 2016-08-26 LAB — LDL CHOLESTEROL, DIRECT: Direct LDL: 127 mg/dL — ABNORMAL HIGH (ref <100)

## 2016-08-28 ENCOUNTER — Encounter: Payer: Self-pay | Admitting: Family Medicine

## 2016-11-05 ENCOUNTER — Ambulatory Visit: Admitting: Family Medicine

## 2016-12-07 ENCOUNTER — Encounter: Payer: Self-pay | Admitting: Family Medicine

## 2016-12-07 ENCOUNTER — Ambulatory Visit (INDEPENDENT_AMBULATORY_CARE_PROVIDER_SITE_OTHER): Admitting: Family Medicine

## 2016-12-07 VITALS — BP 138/74 | HR 120 | Temp 98.1°F | Resp 16 | Ht 64.0 in | Wt 139.0 lb

## 2016-12-07 DIAGNOSIS — Z8669 Personal history of other diseases of the nervous system and sense organs: Secondary | ICD-10-CM | POA: Diagnosis not present

## 2016-12-07 DIAGNOSIS — R000 Tachycardia, unspecified: Secondary | ICD-10-CM

## 2016-12-07 DIAGNOSIS — R519 Headache, unspecified: Secondary | ICD-10-CM

## 2016-12-07 DIAGNOSIS — R51 Headache: Secondary | ICD-10-CM

## 2016-12-07 LAB — COMPLETE METABOLIC PANEL WITH GFR
AG RATIO: 1.8 (calc) (ref 1.0–2.5)
ALKALINE PHOSPHATASE (APISO): 48 U/L (ref 33–115)
ALT: 11 U/L (ref 6–29)
AST: 18 U/L (ref 10–30)
Albumin: 4.6 g/dL (ref 3.6–5.1)
BILIRUBIN TOTAL: 0.8 mg/dL (ref 0.2–1.2)
BUN: 12 mg/dL (ref 7–25)
CHLORIDE: 103 mmol/L (ref 98–110)
CO2: 25 mmol/L (ref 20–32)
Calcium: 9.9 mg/dL (ref 8.6–10.2)
Creat: 0.79 mg/dL (ref 0.50–1.10)
GFR, Est African American: 106 mL/min/{1.73_m2} (ref >=60)
GFR, Est Non African American: 91 mL/min/{1.73_m2} (ref >=60)
Globulin: 2.6 g/dL (calc) (ref 1.9–3.7)
Glucose, Bld: 93 mg/dL (ref 65–99)
POTASSIUM: 4.1 mmol/L (ref 3.5–5.3)
Sodium: 138 mmol/L (ref 135–146)
Total Protein: 7.2 g/dL (ref 6.1–8.1)

## 2016-12-07 LAB — CBC WITH DIFFERENTIAL/PLATELET
BASOS PCT: 1.6 %
Basophils Absolute: 93 cells/uL (ref 0–200)
Eosinophils Absolute: 99 cells/uL (ref 15–500)
Eosinophils Relative: 1.7 %
HCT: 40 % (ref 35.0–45.0)
Hemoglobin: 13 g/dL (ref 11.7–15.5)
LYMPHS ABS: 1369 {cells}/uL (ref 850–3900)
MCH: 27 pg (ref 27.0–33.0)
MCHC: 32.5 g/dL (ref 32.0–36.0)
MCV: 83.2 fL (ref 80.0–100.0)
MPV: 10.5 fL (ref 7.5–12.5)
Monocytes Relative: 8.3 %
Neutro Abs: 3758 cells/uL (ref 1500–7800)
Neutrophils Relative %: 64.8 %
PLATELETS: 299 10*3/uL (ref 140–400)
RBC: 4.81 10*6/uL (ref 3.80–5.10)
RDW: 14.7 % (ref 11.0–15.0)
Total Lymphocyte: 23.6 %
WBC: 5.8 10*3/uL (ref 3.8–10.8)
WBCMIX: 481 {cells}/uL (ref 200–950)

## 2016-12-07 LAB — TSH: TSH: 1.49 m[IU]/L

## 2016-12-07 MED ORDER — SUMATRIPTAN SUCCINATE 50 MG PO TABS: 50.0000 mg | ORAL_TABLET | ORAL | 0 refills | Status: DC | PRN

## 2016-12-07 MED ORDER — TOPIRAMATE 25 MG PO TABS: 50.0000 mg | ORAL_TABLET | Freq: Two times a day (BID) | ORAL | 3 refills | Status: DC

## 2016-12-07 NOTE — Progress Notes (Signed)
Subjective:    Patient ID: Tammy NobleLakiasha Melton, female    DOB: 1970/06/11, 46 y.o.   MRN: [id number]  HPI 01/08/15 Was seen in the ER yesterday for abd/pelvic pain in LLQ.  Ct of abd and pelvis along with pelvic us were negative for any acute process.  She is here for follow up.  States the symptoms began on Saturday. Symptoms were mild left lower quadrant abdominal pain. Sunday they intensified. The abdominal pain is colicky in nature it comes and goes similar to menstrual cramps. However it is much more intense. Starting yesterday the pain began to radiate into her lower back. She  did have trace hematuria. She denies any dysuria. She denies any fevers or chills. She denies any nausea or vomiting. She denies any blood in her stool. Last bowel movement was Sunday. However she is having normal flatus. Food does not exacerbate the pain. There are no exacerbating or alleviating factors. The pain seems to come and go at will.  At that time, my plan was: UA shows trace blood.  Symptoms and history and behavior suggest kidney stone which was possibly obscurred on contrasted CT scan.  No sign or symptom of infection, obstruction, or ovarian torsion.  I will treat empirically as nephrolithiasis with toradol 60 mg im x1, percocet 10/325 poq4 hr prn and flomax 0.4 mg poqday.  Recheck Friday or immediately if worsening or changing.    01/10/15 Her pain has intensified dramatically.   She has not had a bowel movement in 4 days.  Pain is instantly worse as soon as she eats now.  She also reports nausea and vomiting.  She now has intense left lower quadrant abdominal pain. She is guarding on exam with rebound. She screams with minimal palpation of the left lower quadrant. She has absent bowel sounds. Clinically she has a presentation of an acute abdomen.  At that time, my plan was: Patient appears to have the classic presentation for an acute abdomen. Needs urgent evaluation in the emergency room and repeat CT scan versus  surgical consultation.  We will notify the triage nurse at [facility] where she is going.    05/06/15 Ultimately, was treated in the emergency room for constipation with a fleets enema and her pain gradually improved also was given pain medication. That was the end of that episode of her left lower quadrant pain. Ironically it also happened again in February and again in March it always occurs around the time of her menstrual cycle. This month that occurred in a severe patient is seen in this clinic last week and was given MiraLAX for constipation since this was assumed to be the cause of the pain in January. The MiraLAX has not helped. The patient states that she is not constipated. She is having 2-3 bowel movements a day which are soft without straining. The pain is throbbing in nature. She denies any vaginal discharge. She denies any hematuria. She denies any fevers or chills or diarrhea. She denies any symptoms of diverticulitis. She denies any blood in her stool or black tarry stool.  At that time, my plan was: This pain is occurring every month in a cyclic nature that seems to be hormonal in nature. Therefore I'm concerned that she may have endometriosis. She is already had a CT scan as well as a pelvic ultrasound that were unremarkable. Options include a GI consultation for colonoscopy to rule out diverticulosis/diverticulitis however her history and clinical presentation are not consistent with this.  I believe she is experiencing endometriosis and therefore I recommended a GYN consultation. Meanwhile I will start the patient on continuous OC pills/Seasonale 1 by mouth daily to try to help prevent and manage the pain in the future. I will give her Norco 5/325 one to 2 every 6 hours as needed for pain. Also gave her shot of Toradol 60 mg IM 1 now.  Recheck immediately should symptoms worsen.  08/25/16 Patient has been pain-free ever since she started birth control. She has done remarkably well over  the last year. However she is very hesitant to discontinue the birth control because she is afraid of the pain returning. She does not like the weight gain however that she is experienced in starting birth control. Her blood pressure today is also mildly elevated. She denies any chest pain shortness of breath or dyspnea on exertion.  At that time, my plan was: Pain has been controlled ever since she started birth control pills. We discussed the increased risk of cardiovascular disease, stroke, blood clots, and breast cancer on estrogen. We discussed switching the patient to something like an IUD. Patient would like to consider these options been in the meantime she elects to continue to take the risk and continue on oral contraceptive pills. Her blood pressure today is mildly elevated. She will recheck this several times at home over the next week and notify me of their values. If persistently elevated, we may want to consider getting the patient on medication for hypertension. I will also check some baseline lab work including a CBC, CMP, and direct LDL cholesterol  12/07/16 After our last visit, the patient discontinued her birth control.  She has started having normal menstrual cycles again without heavy bleeding.  Unfortunately since discontinuing the birth control, she is started having frequent migraines.  She states that she has suffered with migraines her entire life.  The majority of these are located in the occiput.  They are throbbing.  They are associated with photophobia and phonophobia.  They lead to nausea.  She denies any neurologic deficits associated with the headache.  Headaches typically respond to Excedrin or caffeine.  Unfortunately she is now taking the caffeine and Excedrin multiple times a day and has been doing so for the last 2-3 months.  She is now keeping a constant headache.  Her heart rate is also extremely high today which I believe is partly due to the caffeine use Past Medical  History:  Diagnosis Date  . Anemia   . Depression   . High blood pressure    h/o  . Migraines    Past Surgical History:  Procedure Laterality Date  . CESAREAN SECTION    . TUBAL LIGATION     Current Outpatient Medications on File Prior to Visit  Medication Sig Dispense Refill  . aspirin-acetaminophen-caffeine (EXCEDRIN MIGRAINE) 250-250-65 MG tablet Take by mouth every 6 (six) hours as needed for headache.    . levonorgestrel-ethinyl estradiol (QUASENSE) 0.15-0.03 MG tablet Take 1 tablet by mouth daily. 1 Package 3   No current facility-administered medications on file prior to visit.    No Known Allergies Social History   Socioeconomic History  . Marital status: Married    Spouse name: Not on file  . Number of children: Not on file  . Years of education: Not on file  . Highest education level: Not on file  Social Needs  . Financial resource strain: Not on file  . Food insecurity - worry: Not on file  .  Food insecurity - inability: Not on file  . Transportation needs - medical: Not on file  . Transportation needs - non-medical: Not on file  Occupational History  . Not on file  Tobacco Use  . Smoking status: Never Smoker  . Smokeless tobacco: Never Used  Substance and Sexual Activity  . Alcohol use: No  . Drug use: No  . Sexual activity: Not on file  Other Topics Concern  . Not on file  Social History Narrative  . Not on file     Review of Systems  All other systems reviewed and are negative.      Objective:   Physical Exam  Constitutional: She appears well-developed and well-nourished. No distress.  HENT:  Head: Normocephalic.  Right Ear: External ear normal.  Left Ear: External ear normal.  Nose: Nose normal.  Cardiovascular: Normal rate, regular rhythm and normal heart sounds. Exam reveals no gallop and no friction rub.  No murmur heard. Pulmonary/Chest: Effort normal and breath sounds normal. No respiratory distress. She has no wheezes. She has no  rales.  Abdominal: Bowel sounds are normal. She exhibits no distension and no mass. There is no tenderness. There is no rebound and no guarding.  Skin: She is not diaphoretic.  Vitals reviewed.        Assessment & Plan:   Tachycardia - Plan: CBC with Differential/Platelet, COMPLETE METABOLIC PANEL WITH GFR, TSH Chronic daily headaches Migraines  I believe the tachycardia is likely due to the caffeine.  I recommended discontinuation of caffeine and replacing it with Imitrex 50 mg p.o. as needed migraine headache.  I would focus on trying to prevent the migraine headaches by starting her on Topamax 25 mg p.o. nightly.  Every week I want her to add an additional 25 mg of Topamax per day until after 4 weeks, she is taking 50 mg in the morning and 50 mg at night to prevent the migraine headaches.  I believe the institution of Topamax and the discontinuation of caffeine will hopefully stop the migraine headaches.  Hopefully by cutting out the caffeine, the tachycardia will also improve.  I will check a CBC to rule out anemia, TSH to rule out hyperthyroidism, and a CMP to evaluate for any electrolyte disturbances.  Neurologic exam today is completely normal.  Cranial nerves II through XII are grossly intact with muscle strength 5/5 equal and symmetric in the upper and lower extremities.  Patient has normal Romberg sign.  Recheck headache frequency and heartrate in 1 month.

## 2017-04-08 ENCOUNTER — Other Ambulatory Visit: Payer: Self-pay | Admitting: Family Medicine

## 2017-06-15 ENCOUNTER — Other Ambulatory Visit: Payer: Self-pay | Admitting: Family Medicine

## 2017-08-19 ENCOUNTER — Other Ambulatory Visit: Payer: Self-pay | Admitting: Family Medicine

## 2018-02-14 ENCOUNTER — Encounter: Payer: Self-pay | Admitting: Family Medicine

## 2018-02-14 ENCOUNTER — Ambulatory Visit (INDEPENDENT_AMBULATORY_CARE_PROVIDER_SITE_OTHER): Admitting: Family Medicine

## 2018-02-14 ENCOUNTER — Other Ambulatory Visit: Payer: Self-pay

## 2018-02-14 VITALS — BP 128/72 | HR 82 | Temp 98.7°F | Resp 14 | Ht 64.0 in | Wt 135.0 lb

## 2018-02-14 DIAGNOSIS — N809 Endometriosis, unspecified: Secondary | ICD-10-CM | POA: Diagnosis not present

## 2018-02-14 DIAGNOSIS — R1032 Left lower quadrant pain: Secondary | ICD-10-CM

## 2018-02-14 MED ORDER — NORETHIN ACE-ETH ESTRAD-FE 1-20 MG-MCG PO TABS: 1.0000 | ORAL_TABLET | Freq: Every day | ORAL | 11 refills | Status: DC

## 2018-02-14 MED ORDER — DICLOFENAC SODIUM 75 MG PO TBEC: 75.0000 mg | DELAYED_RELEASE_TABLET | Freq: Two times a day (BID) | ORAL | 0 refills | Status: DC | PRN

## 2018-02-14 NOTE — Patient Instructions (Addendum)
F/u May for PAP Smear  - PHYSICAL  ( Dr. Tanya Nones is PCP) Come fasting

## 2018-02-14 NOTE — Assessment & Plan Note (Signed)
Her working diagnosis has been endometriosis with response to hormone therapy in the past, but she does not want to resume this just yet. No bowel changes, urinary symptoms.  Pain does feel like her previous episodes however.  At this time I will give her diclofenac to take twice a day she was on this in the past.  I have given a prescription for Loestrin work and a wait-and-see what her body just during her next cycle as she does get recurrent pain she will restart the birth control.  She could also be a good candidate for IUD but she states this was not discussed with her by GYN.  He had a fairly benign abdominal exam we will hold on any imaging.  He is due for GYN exam in May of this year.

## 2018-02-14 NOTE — Progress Notes (Signed)
   Subjective:    Patient ID: Tammy Melton, female    DOB: [birth year], 48 y.o.   MRN: [id number]  Patient presents for LLQ Pain (x1 week- no changes to BM, pain constant over weekend, but has since lessened to intrmittent, described as throbbing- has been dx with endometreosis in 2017)  Pt here with abdominal pain, has history of presumed endometriosis,.Last week hadsame pain she has had in the past. LLQ pain as previous started last Wed Jan 5th . She had very intense pain with bowel movements.  Bowels however are normal.  No blood in the stool, no vaginal bleeding,  No dysuria , no discharge. No vomiting, No fever.  She stopped the birh control because her blood pressure stating goingup, gained about 20lbs.  She was on seasonalle  Pain is significantly improved now only mild She has migraines- taking excedrin when she has a flare, a few times a month. She was given topamax, but had side effects  Current in school for early childhood education , works in [employer]   LMP- January 30th        Review Of Systems:  GEN- denies fatigue, fever, weight loss,weakness, recent illness HEENT- denies eye drainage, change in vision, nasal discharge, CVS- denies chest pain, palpitations RESP- denies SOB, cough, wheeze ABD- denies N/V, change in stools, +abd pain GU- denies dysuria, hematuria, dribbling, incontinence MSK- denies joint pain, muscle aches, injury Neuro- denies headache, dizziness, syncope, seizure activity       Objective:    BP 128/72   Pulse 82   Temp 98.7 F (37.1 C) (Oral)   Resp 14   Ht 5\' 4"  (1.626 m)   Wt 135 lb (61.2 kg)   LMP 02/03/2018 Comment: regular  SpO2 100%   BMI 23.17 kg/m  GEN- NAD, alert and oriented x3 HEENT- PERRL, EOMI, non injected sclera, pink conjunctiva, MMM, oropharynx clear Neck- Supple,  CVS- RRR, no murmur RESP-CTAB ABD-NABS,soft,ND, NT, no rebound, no guarding  EXT- No edema Pulses- Radial, 2+        Assessment & Plan:       Problem List Items Addressed This Visit      Unprioritized   Endometriosis    Her working diagnosis has been endometriosis with response to hormone therapy in the past, but she does not want to resume this just yet. No bowel changes, urinary symptoms.  Pain does feel like her previous episodes however.  At this time I will give her diclofenac to take twice a day she was on this in the past.  I have given a prescription for Loestrin work and a wait-and-see what her body just during her next cycle as she does get recurrent pain she will restart the birth control.  She could also be a good candidate for IUD but she states this was not discussed with her by GYN.  He had a fairly benign abdominal exam we will hold on any imaging.  He is due for GYN exam in May of this year.      Relevant Orders   CBC with Differential/Platelet   Comprehensive metabolic panel   FSH/LH   TSH    Other Visit Diagnoses    LLQ pain    -  Primary      Note: This dictation was prepared with Dragon dictation along with smaller phrase technology. Any transcriptional errors that result from this process are unintentional.

## 2018-02-15 LAB — CBC WITH DIFFERENTIAL/PLATELET
ABSOLUTE MONOCYTES: 618 {cells}/uL (ref 200–950)
Basophils Absolute: 71 cells/uL (ref 0–200)
Basophils Relative: 1 %
Eosinophils Absolute: 7 cells/uL — ABNORMAL LOW (ref 15–500)
Eosinophils Relative: 0.1 %
HCT: 34.5 % — ABNORMAL LOW (ref 35.0–45.0)
Hemoglobin: 10.4 g/dL — ABNORMAL LOW (ref 11.7–15.5)
Lymphs Abs: 1164 cells/uL (ref 850–3900)
MCH: 21.5 pg — AB (ref 27.0–33.0)
MCHC: 30.1 g/dL — AB (ref 32.0–36.0)
MCV: 71.4 fL — AB (ref 80.0–100.0)
MPV: 10.5 fL (ref 7.5–12.5)
Monocytes Relative: 8.7 %
NEUTROS PCT: 73.8 %
Neutro Abs: 5240 cells/uL (ref 1500–7800)
PLATELETS: 448 10*3/uL — AB (ref 140–400)
RBC: 4.83 10*6/uL (ref 3.80–5.10)
RDW: 17.2 % — ABNORMAL HIGH (ref 11.0–15.0)
TOTAL LYMPHOCYTE: 16.4 %
WBC: 7.1 10*3/uL (ref 3.8–10.8)

## 2018-02-15 LAB — COMPREHENSIVE METABOLIC PANEL
AG Ratio: 1.6 (calc) (ref 1.0–2.5)
ALBUMIN MSPROF: 4.7 g/dL (ref 3.6–5.1)
ALKALINE PHOSPHATASE (APISO): 44 U/L (ref 31–125)
ALT: 10 U/L (ref 6–29)
AST: 13 U/L (ref 10–35)
BUN: 11 mg/dL (ref 7–25)
CO2: 23 mmol/L (ref 20–32)
CREATININE: 0.76 mg/dL (ref 0.50–1.10)
Calcium: 10.1 mg/dL (ref 8.6–10.2)
Chloride: 106 mmol/L (ref 98–110)
Globulin: 2.9 g/dL (calc) (ref 1.9–3.7)
Glucose, Bld: 80 mg/dL (ref 65–99)
POTASSIUM: 3.9 mmol/L (ref 3.5–5.3)
Sodium: 140 mmol/L (ref 135–146)
TOTAL PROTEIN: 7.6 g/dL (ref 6.1–8.1)
Total Bilirubin: 0.7 mg/dL (ref 0.2–1.2)

## 2018-02-15 LAB — FSH/LH
FSH: 8.4 m[IU]/mL
LH: 4.6 m[IU]/mL

## 2018-02-15 LAB — TSH: TSH: 0.92 mIU/L

## 2018-03-13 ENCOUNTER — Other Ambulatory Visit: Payer: Self-pay | Admitting: Family Medicine

## 2018-05-16 ENCOUNTER — Encounter: Admitting: Family Medicine

## 2018-06-14 ENCOUNTER — Encounter: Payer: Self-pay | Admitting: Family Medicine

## 2018-06-14 ENCOUNTER — Ambulatory Visit (INDEPENDENT_AMBULATORY_CARE_PROVIDER_SITE_OTHER): Admitting: Family Medicine

## 2018-06-14 ENCOUNTER — Other Ambulatory Visit: Payer: Self-pay

## 2018-06-14 VITALS — BP 126/78 | HR 113 | Temp 98.3°F | Resp 17 | Ht 64.0 in | Wt 144.0 lb

## 2018-06-14 DIAGNOSIS — Z0000 Encounter for general adult medical examination without abnormal findings: Secondary | ICD-10-CM

## 2018-06-14 DIAGNOSIS — Z0001 Encounter for general adult medical examination with abnormal findings: Secondary | ICD-10-CM

## 2018-06-14 DIAGNOSIS — D508 Other iron deficiency anemias: Secondary | ICD-10-CM

## 2018-06-14 DIAGNOSIS — Z1239 Encounter for other screening for malignant neoplasm of breast: Secondary | ICD-10-CM | POA: Diagnosis not present

## 2018-06-14 DIAGNOSIS — Z124 Encounter for screening for malignant neoplasm of cervix: Secondary | ICD-10-CM

## 2018-06-14 DIAGNOSIS — Z13220 Encounter for screening for lipoid disorders: Secondary | ICD-10-CM

## 2018-06-14 NOTE — Progress Notes (Signed)
Subjective:    Patient ID: Tammy Melton, female    DOB: October 02, 1970, 48 y.o.   MRN: [id number]  HPI Patient is here today for complete physical exam.  Past medical history is significant for chronic episodic abdominal pain.  Most likely explanation has been endometriosis however this is a clinical diagnosis.  Previously she was taking contraceptive pills for this but discontinued the medication due to weight gain.  She is currently not using any form of contraception.  However she is not dealing with any abdominal pain and therefore she does not want to start birth control is back.  She was seen in February and found to be anemic.  She is not taking her iron pills.  Her hemoglobin was 10.5 in February.  This is presumed to be iron deficiency anemia secondary to her low MCV presumed to be due to vaginal bleeding.  She denies any melena or hematochezia.  She denies any heavy periods.  Her periods are regular.  She denies any chest pain or shortness of breath or dyspnea on exertion.  She is overdue for mammogram.  Manual breast exam was performed today and is normal.  She is also due for Pap smear.  She denies any family history of colon cancer and she denies any GI bleeding and therefore does not require colonoscopy until age 48 Past Medical History:  Diagnosis Date  . Anemia   . Depression   . High blood pressure    h/o  . Migraines    Past Surgical History:  Procedure Laterality Date  . CESAREAN SECTION    . TUBAL LIGATION     Current Outpatient Medications on File Prior to Visit  Medication Sig Dispense Refill  . aspirin-acetaminophen-caffeine (EXCEDRIN MIGRAINE) 250-250-65 MG tablet Take by mouth every 6 (six) hours as needed for headache.    . diclofenac (VOLTAREN) 75 MG EC tablet TAKE 1 TABLET(75 MG) BY MOUTH TWICE DAILY AS NEEDED (Patient not taking: Reported on 06/14/2018) 60 tablet 0   No current facility-administered medications on file prior to visit.    No Known Allergies  Social History   Socioeconomic History  . Marital status: Married    Spouse name: Not on file  . Number of children: Not on file  . Years of education: Not on file  . Highest education level: Not on file  Occupational History  . Not on file  Social Needs  . Financial resource strain: Not on file  . Food insecurity:    Worry: Not on file    Inability: Not on file  . Transportation needs:    Medical: Not on file    Non-medical: Not on file  Tobacco Use  . Smoking status: Never Smoker  . Smokeless tobacco: Never Used  Substance and Sexual Activity  . Alcohol use: No  . Drug use: No  . Sexual activity: Not on file  Lifestyle  . Physical activity:    Days per week: Not on file    Minutes per session: Not on file  . Stress: Not on file  Relationships  . Social connections:    Talks on phone: Not on file    Gets together: Not on file    Attends religious service: Not on file    Active member of club or organization: Not on file    Attends meetings of clubs or organizations: Not on file    Relationship status: Not on file  . Intimate partner violence:    Fear of current  or ex partner: Not on file    Emotionally abused: Not on file    Physically abused: Not on file    Forced sexual activity: Not on file  Other Topics Concern  . Not on file  Social History Narrative  . Not on file     Review of Systems  All other systems reviewed and are negative.      Objective:   Physical Exam  Constitutional: She is oriented to person, place, and time. She appears well-developed and well-nourished. No distress.  HENT:  Head: Normocephalic.  Right Ear: External ear normal.  Left Ear: External ear normal.  Nose: Nose normal.  Mouth/Throat: Oropharynx is clear and moist. No oropharyngeal exudate.  Eyes: Conjunctivae and EOM are normal.  Neck: Neck supple. No JVD present. No thyromegaly present.  Cardiovascular: Normal rate, regular rhythm and normal heart sounds. Exam reveals  no gallop and no friction rub.  No murmur heard. Pulmonary/Chest: Effort normal and breath sounds normal. No respiratory distress. She has no wheezes. She has no rales.  Abdominal: She exhibits no distension and no mass. Bowel sounds are absent. There is no abdominal tenderness. There is no rebound and no guarding.  Musculoskeletal:        General: No edema.  Lymphadenopathy:    She has no cervical adenopathy.  Neurological: She is alert and oriented to person, place, and time. She has normal reflexes. She displays normal reflexes. No cranial nerve deficit. She exhibits normal muscle tone. Coordination normal.  Skin: Skin is warm. No rash noted. She is not diaphoretic. No erythema. No pallor.  Psychiatric: She has a normal mood and affect. Her behavior is normal. Judgment and thought content normal.  Vitals reviewed.        Assessment & Plan:   Breast cancer screening - Plan: MM Digital Screening  Screening cholesterol level - Plan: Lipid panel  Other iron deficiency anemia - Plan: CBC with Differential/Platelet  Cervical cancer screening - Plan: PAP, Thin Prep w/HPV rflx HPV Type 16/18  General medical exam Physical exam today is normal except for slight bladder prolapse which is asymptomatic and does not bother the patient.  The remainder of her physical exam is normal aside from some mild tachycardia which the patient thinks is due to anxiety over getting the exam today.  Her blood pressure is excellent.  I will schedule the patient for a mammogram.  I will also check her cholesterol.  Recheck her CBC and if still anemic I would recommend iron supplementation as well as a work-up for her anemia.  Pap smear was performed today and is sent to pathology in a labeled container.  She had a CMP checked in February that was normal.  Immunizations are up-to-date.  Regular anticipatory guidance is provided.

## 2018-06-15 LAB — CBC WITH DIFFERENTIAL/PLATELET
Absolute Monocytes: 382 cells/uL (ref 200–950)
Basophils Absolute: 68 cells/uL (ref 0–200)
Basophils Relative: 1.2 %
Eosinophils Absolute: 63 cells/uL (ref 15–500)
Eosinophils Relative: 1.1 %
HCT: 35.4 % (ref 35.0–45.0)
Hemoglobin: 11 g/dL — ABNORMAL LOW (ref 11.7–15.5)
Lymphs Abs: 1020 cells/uL (ref 850–3900)
MCH: 23.6 pg — ABNORMAL LOW (ref 27.0–33.0)
MCHC: 31.1 g/dL — ABNORMAL LOW (ref 32.0–36.0)
MCV: 75.8 fL — ABNORMAL LOW (ref 80.0–100.0)
MPV: 10.8 fL (ref 7.5–12.5)
Monocytes Relative: 6.7 %
Neutro Abs: 4167 cells/uL (ref 1500–7800)
Neutrophils Relative %: 73.1 %
Platelets: 341 10*3/uL (ref 140–400)
RBC: 4.67 10*6/uL (ref 3.80–5.10)
RDW: 17.4 % — ABNORMAL HIGH (ref 11.0–15.0)
Total Lymphocyte: 17.9 %
WBC: 5.7 10*3/uL (ref 3.8–10.8)

## 2018-06-15 LAB — LIPID PANEL
Cholesterol: 174 mg/dL (ref <200)
HDL: 43 mg/dL — ABNORMAL LOW (ref >=50)
LDL Cholesterol (Calc): 115 mg/dL (calc) — ABNORMAL HIGH
Non-HDL Cholesterol (Calc): 131 mg/dL (calc) — ABNORMAL HIGH (ref <130)
Total CHOL/HDL Ratio: 4 (calc) (ref <5.0)
Triglycerides: 65 mg/dL (ref <150)

## 2018-06-16 ENCOUNTER — Other Ambulatory Visit: Payer: Self-pay | Admitting: Family Medicine

## 2018-06-16 DIAGNOSIS — D508 Other iron deficiency anemias: Secondary | ICD-10-CM

## 2018-06-16 LAB — PAP, TP IMAGING W/ HPV RNA, RFLX HPV TYPE 16,18/45: HPV DNA High Risk: NOT DETECTED

## 2018-07-25 ENCOUNTER — Other Ambulatory Visit: Payer: Self-pay

## 2018-07-26 ENCOUNTER — Encounter: Payer: Self-pay | Admitting: Family Medicine

## 2018-07-26 ENCOUNTER — Ambulatory Visit (INDEPENDENT_AMBULATORY_CARE_PROVIDER_SITE_OTHER): Admitting: Family Medicine

## 2018-07-26 VITALS — BP 122/66 | HR 90 | Temp 98.2°F | Resp 14 | Ht 64.0 in | Wt 143.0 lb

## 2018-07-26 DIAGNOSIS — N809 Endometriosis, unspecified: Secondary | ICD-10-CM | POA: Diagnosis not present

## 2018-07-26 DIAGNOSIS — D509 Iron deficiency anemia, unspecified: Secondary | ICD-10-CM | POA: Diagnosis not present

## 2018-07-26 NOTE — Assessment & Plan Note (Signed)
Hemoglobin has come up to 11 on her last check a month ago.  She is taking iron once a day.  We will recheck her iron stores and her CBC again.  When we discussed her lab results she will let us know she wants to go ahead and be referred to GYN right now to have IUD placed to help with the heavy bleeding.

## 2018-07-26 NOTE — Patient Instructions (Addendum)
F/U pending results  We will call with lab results

## 2018-07-26 NOTE — Progress Notes (Signed)
   Subjective:    Patient ID: Tammy Melton, female    DOB: Jan 04, 1971, 48 y.o.   MRN: [id number]  Patient presents for Follow-up (is fasting)   Pt here f/u medications. Chronic anemia- taking iron sulfate 325mg  once a day, with OJ for vitamin C, no bowel problems. She has not seen GYN, she still has heavy cycles  Working dx of Endometriosis , she is still contemplating getting IUD No blood in stool   Migraines takes excedein  Recently had CPE in June    Review Of Systems:  GEN- denies fatigue, fever, weight loss,weakness, recent illness HEENT- denies eye drainage, change in vision, nasal discharge, CVS- denies chest pain, palpitations RESP- denies SOB, cough, wheeze ABD- denies N/V, change in stools, abd pain GU- denies dysuria, hematuria, dribbling, incontinence MSK- denies joint pain, muscle aches, injury Neuro- denies headache, dizziness, syncope, seizure activity       Objective:    BP 122/66   Pulse 90   Temp 98.2 F (36.8 C) (Oral)   Resp 14   Ht 5\' 4"  (1.626 m)   Wt 143 lb (64.9 kg)   LMP 07/21/2018 Comment: regular  SpO2 98%   BMI 24.55 kg/m  GEN- NAD, alert and oriented x3 HEENT- PERRL, EOMI, non injected sclera, pale conjunctiva, MMM, oropharynx clear Neck- Supple, no thyromegaly CVS- RRR, no murmur RESP-CTAB EXT- No edema Pulses- Radial  2+        Assessment & Plan:      Problem List Items Addressed This Visit      Unprioritized   Anemia - Primary    Hemoglobin has come up to 11 on her last check a month ago.  She is taking iron once a day.  We will recheck her iron stores and her CBC again.  When we discussed her lab results she will let us know she wants to go ahead and be referred to GYN right now to have IUD placed to help with the heavy bleeding.      Relevant Medications   ferrous sulfate 325 (65 FE) MG tablet   Other Relevant Orders   Iron, TIBC and Ferritin Panel   CBC with Differential/Platelet   Endometriosis   Relevant  Orders   Iron, TIBC and Ferritin Panel   CBC with Differential/Platelet      Note: This dictation was prepared with Dragon dictation along with smaller phrase technology. Any transcriptional errors that result from this process are unintentional.

## 2018-07-27 LAB — CBC WITH DIFFERENTIAL/PLATELET
Absolute Monocytes: 382 cells/uL (ref 200–950)
Basophils Absolute: 98 cells/uL (ref 0–200)
Basophils Relative: 2 %
Eosinophils Absolute: 88 cells/uL (ref 15–500)
Eosinophils Relative: 1.8 %
HCT: 35.9 % (ref 35.0–45.0)
Hemoglobin: 10.8 g/dL — ABNORMAL LOW (ref 11.7–15.5)
Lymphs Abs: 1186 cells/uL (ref 850–3900)
MCH: 23.1 pg — ABNORMAL LOW (ref 27.0–33.0)
MCHC: 30.1 g/dL — ABNORMAL LOW (ref 32.0–36.0)
MCV: 76.9 fL — ABNORMAL LOW (ref 80.0–100.0)
MPV: 11.3 fL (ref 7.5–12.5)
Monocytes Relative: 7.8 %
Neutro Abs: 3146 cells/uL (ref 1500–7800)
Neutrophils Relative %: 64.2 %
Platelets: 357 10*3/uL (ref 140–400)
RBC: 4.67 10*6/uL (ref 3.80–5.10)
RDW: 17.6 % — ABNORMAL HIGH (ref 11.0–15.0)
Total Lymphocyte: 24.2 %
WBC: 4.9 10*3/uL (ref 3.8–10.8)

## 2018-07-27 LAB — IRON,TIBC AND FERRITIN PANEL
%SAT: 6 % (calc) — ABNORMAL LOW (ref 16–45)
Ferritin: 3 ng/mL — ABNORMAL LOW (ref 16–232)
Iron: 23 ug/dL — ABNORMAL LOW (ref 40–190)
TIBC: 416 mcg/dL (calc) (ref 250–450)

## 2018-07-29 ENCOUNTER — Other Ambulatory Visit: Payer: Self-pay | Admitting: *Deleted

## 2018-07-29 DIAGNOSIS — N809 Endometriosis, unspecified: Secondary | ICD-10-CM

## 2018-07-29 MED ORDER — FERROUS SULFATE 325 (65 FE) MG PO TABS: 325.0000 mg | ORAL_TABLET | Freq: Two times a day (BID) | ORAL | Status: DC

## 2018-08-19 ENCOUNTER — Other Ambulatory Visit: Payer: Self-pay | Admitting: *Deleted

## 2018-08-19 DIAGNOSIS — Z20822 Contact with and (suspected) exposure to covid-19: Secondary | ICD-10-CM

## 2018-08-21 LAB — NOVEL CORONAVIRUS, NAA: SARS-CoV-2, NAA: NOT DETECTED

## 2018-10-10 ENCOUNTER — Telehealth: Payer: Self-pay | Admitting: *Deleted

## 2018-10-10 DIAGNOSIS — N809 Endometriosis, unspecified: Secondary | ICD-10-CM

## 2018-10-10 NOTE — Telephone Encounter (Signed)
Received VM from patient.   Patient reports that she has questions in regards to GYN referral.   Call placed to patient. [place].

## 2018-10-11 NOTE — Telephone Encounter (Signed)
Call placed to patient.   States that she still has not heard from anyone in regards to [facility] referral.   Advised that appointment was scheduled in Sept at [facility] with Dr. Carlynn Purl. States that she was unaware of this appointment. Advised to contact Dr. Ivor Costa office to re-scheduled.

## 2018-10-12 NOTE — Telephone Encounter (Signed)
Referral orders placed

## 2018-10-12 NOTE — Telephone Encounter (Signed)
Okay to place referral again, Dx Endometriosis

## 2018-10-12 NOTE — Telephone Encounter (Signed)
Received turn call from patient.   States that previous referral has expired and requested new referral to be placed.   Ok to lace referral?

## 2018-10-12 NOTE — Addendum Note (Signed)
Addended by: Sheral Flow on: 10/12/2018 08:50 AM   Modules accepted: Orders

## 2019-04-20 ENCOUNTER — Encounter: Payer: Self-pay | Admitting: Family Medicine

## 2019-04-20 ENCOUNTER — Other Ambulatory Visit: Payer: Self-pay

## 2019-04-20 ENCOUNTER — Ambulatory Visit (INDEPENDENT_AMBULATORY_CARE_PROVIDER_SITE_OTHER): Admitting: Family Medicine

## 2019-04-20 VITALS — BP 170/84 | HR 118 | Temp 97.2°F | Resp 14 | Ht 64.0 in | Wt 144.0 lb

## 2019-04-20 DIAGNOSIS — M25562 Pain in left knee: Secondary | ICD-10-CM | POA: Diagnosis not present

## 2019-04-20 MED ORDER — MELOXICAM 15 MG PO TABS: 15.0000 mg | ORAL_TABLET | Freq: Every day | ORAL | 0 refills | Status: DC

## 2019-04-20 NOTE — Progress Notes (Signed)
Subjective:    Patient ID: Tammy Melton, female    DOB: [birth year], 49 y.o.   MRN: [id number]  HPI Patient is a very pleasant 49 year old African-American female who presents today complaining of severe pain in her left knee.  This is been going on for several weeks however it has gotten steadily worse.  She states that whenever she squats and tries to stand up she will have sharp pain under and around her kneecap.  She also complains of severe pain over the medial and lateral joint lines.  She has no laxity to varus or valgus stress.  She has negative anterior and posterior drawer sign.  She has no pain with Apley grind however she does have pain with ambulation and bearing weight particularly on the medial joint line.  There is a slight effusion.  There is no erythema.  However the left knee is visibly more swollen than the right knee.  She also complains of some mild stiffness in the PIP joint on her right third digit.  The exam today is unremarkable however.  Patient's blood pressure significantly elevated.  She has not been checking it at home however she is hesitant to start medication. Past Medical History:  Diagnosis Date  . Anemia   . Depression   . High blood pressure    h/o  . Migraines    Past Surgical History:  Procedure Laterality Date  . CESAREAN SECTION    . TUBAL LIGATION     Current Outpatient Medications on File Prior to Visit  Medication Sig Dispense Refill  . diclofenac Sodium (VOLTAREN) 1 % GEL Apply 2 g topically 4 (four) times daily.     No current facility-administered medications on file prior to visit.   No Known Allergies Social History   Socioeconomic History  . Marital status: Married    Spouse name: Not on file  . Number of children: Not on file  . Years of education: Not on file  . Highest education level: Not on file  Occupational History  . Not on file  Tobacco Use  . Smoking status: Never Smoker  . Smokeless tobacco: Never Used  Substance  and Sexual Activity  . Alcohol use: No  . Drug use: No  . Sexual activity: Not on file  Other Topics Concern  . Not on file  Social History Narrative  . Not on file   Social Determinants of Health   Financial Resource Strain:   . Difficulty of Paying Living Expenses:   Food Insecurity:   . Worried About Charity fundraiser in the Last Year:   . Arboriculturist in the Last Year:   Transportation Needs:   . Film/video editor (Medical):   Marland Kitchen Lack of Transportation (Non-Medical):   Physical Activity:   . Days of Exercise per Week:   . Minutes of Exercise per Session:   Stress:   . Feeling of Stress :   Social Connections:   . Frequency of Communication with Friends and Family:   . Frequency of Social Gatherings with Friends and Family:   . Attends Religious Services:   . Active Member of Clubs or Organizations:   . Attends Archivist Meetings:   Marland Kitchen Marital Status:   Intimate Partner Violence:   . Fear of Current or Ex-Partner:   . Emotionally Abused:   Marland Kitchen Physically Abused:   . Sexually Abused:      Review of Systems  All other systems reviewed and  are negative.      Objective:   Physical Exam  Constitutional: She appears well-developed and well-nourished. No distress.  HENT:  Head: Normocephalic.  Right Ear: External ear normal.  Left Ear: External ear normal.  Nose: Nose normal.  Cardiovascular: Normal rate, regular rhythm and normal heart sounds. Exam reveals no gallop and no friction rub.  No murmur heard. Pulmonary/Chest: Effort normal and breath sounds normal. No respiratory distress. She has no wheezes. She has no rales.  Musculoskeletal:     Right knee: No swelling or effusion. Normal range of motion. No medial joint line or lateral joint line tenderness.     Left knee: Swelling and effusion present. Decreased range of motion. Tenderness present over the medial joint line and lateral joint line.  Skin: She is not diaphoretic.  Vitals  reviewed.        Assessment & Plan:  Acute pain of left knee - Plan: DG Knee Complete 4 Views Left, meloxicam (MOBIC) 15 MG tablet  Blood pressure is extremely high today.  Patient is resistant to starting medication.  She states that she will check her blood pressure twice a day for the next few days and call me with the readings.  If consistently greater than 140 systolic or 90 diastolic I would recommend starting hydrochlorothiazide.  Meanwhile start patient on meloxicam 15 mg a day for the knee pain which I suspect is osteoarthritis.  Obtain an x-ray of the left knee.  Patient states the pain is significant.  Therefore in addition to the x-ray she would like to receive a cortisone injection.  Using sterile technique, I injected the left knee with 2 cc lidocaine, 2 cc of Marcaine, and 2 cc of 40 mg/mL Kenalog.  The patient tolerated procedure well without complication.  She will call me back early next week with her blood pressure as it is extremely high today.

## 2019-04-21 ENCOUNTER — Ambulatory Visit
Admission: RE | Admit: 2019-04-21 | Discharge: 2019-04-21 | Disposition: A | Discharge status: Home / Self Care | Source: Ambulatory Visit | Attending: Family Medicine | Admitting: Family Medicine

## 2019-04-21 DIAGNOSIS — M25562 Pain in left knee: Secondary | ICD-10-CM

## 2019-05-18 ENCOUNTER — Other Ambulatory Visit: Payer: Self-pay | Admitting: Family Medicine

## 2019-05-18 DIAGNOSIS — M25562 Pain in left knee: Secondary | ICD-10-CM

## 2020-01-03 ENCOUNTER — Other Ambulatory Visit: Payer: Self-pay | Admitting: Obstetrics and Gynecology

## 2020-01-03 DIAGNOSIS — R921 Mammographic calcification found on diagnostic imaging of breast: Secondary | ICD-10-CM

## 2020-01-25 ENCOUNTER — Ambulatory Visit (INDEPENDENT_AMBULATORY_CARE_PROVIDER_SITE_OTHER): Admitting: Family Medicine

## 2020-01-25 ENCOUNTER — Other Ambulatory Visit: Payer: Self-pay

## 2020-01-25 ENCOUNTER — Encounter: Payer: Self-pay | Admitting: Family Medicine

## 2020-01-25 ENCOUNTER — Telehealth: Payer: Self-pay

## 2020-01-25 VITALS — BP 166/94 | HR 116 | Temp 98.0°F | Wt 150.0 lb

## 2020-01-25 DIAGNOSIS — M5412 Radiculopathy, cervical region: Secondary | ICD-10-CM | POA: Diagnosis not present

## 2020-01-25 DIAGNOSIS — N3281 Overactive bladder: Secondary | ICD-10-CM

## 2020-01-25 MED ORDER — PREDNISONE 20 MG PO TABS: ORAL_TABLET | ORAL | 0 refills | Status: DC

## 2020-01-25 MED ORDER — SOLIFENACIN SUCCINATE 10 MG PO TABS: 10.0000 mg | ORAL_TABLET | Freq: Every day | ORAL | 3 refills | Status: DC

## 2020-01-25 NOTE — Progress Notes (Signed)
Subjective:    Patient ID: Tammy Melton, female    DOB: [birth year], 50 y.o.   MRN: [id number]  HPI  Patient has 2 problems.  First she reports a 29-month history of pain radiating from her neck into her left shoulder and down her left arm.  If she lays on her side her left arm will become numb.  It aches and throbs from her shoulder down to her fingertips.  However she has full painless range of motion in the left shoulder.  She has a negative empty can sign.  She has a negative Hawking sign.  She has normal strength in the left arm.  However she has significant pain with Spurling's maneuver.  I am able to reproduce the pain with this in fact the burning pain shoots down her arm when I press on her forehead and recreates the pain completely.  This is suspicious for cervical radiculopathy.  Second she reports urinary incontinence.  She denies incontinence with coughing or sneezing.  This happened suddenly and without warning.  She denies any history or symptoms of a bladder prolapse.  She sees gynecology for her pelvic exam but she is never been told that she has a prolapsed bladder.  She denies stress incontinence.  Symptoms are more with urge  Past Medical History:  Diagnosis Date  . Anemia   . Depression   . High blood pressure    h/o  . Migraines    Past Surgical History:  Procedure Laterality Date  . CESAREAN SECTION    . TUBAL LIGATION     Current Outpatient Medications on File Prior to Visit  Medication Sig Dispense Refill  . diclofenac Sodium (VOLTAREN) 1 % GEL Apply 2 g topically 4 (four) times daily.    . meloxicam (MOBIC) 15 MG tablet TAKE 1 TABLET(15 MG) BY MOUTH DAILY 30 tablet 0   No current facility-administered medications on file prior to visit.   No Known Allergies Social History   Socioeconomic History  . Marital status: Married    Spouse name: Not on file  . Number of children: Not on file  . Years of education: Not on file  . Highest education level: Not on  file  Occupational History  . Not on file  Tobacco Use  . Smoking status: Never Smoker  . Smokeless tobacco: Never Used  Substance and Sexual Activity  . Alcohol use: No  . Drug use: No  . Sexual activity: Not on file  Other Topics Concern  . Not on file  Social History Narrative  . Not on file   Social Determinants of Health   Financial Resource Strain: Not on file  Food Insecurity: Not on file  Transportation Needs: Not on file  Physical Activity: Not on file  Stress: Not on file  Social Connections: Not on file  Intimate Partner Violence: Not on file     Review of Systems  All other systems reviewed and are negative.      Objective:   Physical Exam Vitals reviewed.  Constitutional:      General: She is not in acute distress.    Appearance: She is well-developed. She is not diaphoretic.  HENT:     Head: Normocephalic.     Right Ear: External ear normal.     Left Ear: External ear normal.     Nose: Nose normal.  Cardiovascular:     Rate and Rhythm: Normal rate and regular rhythm.     Heart sounds: Normal heart  sounds. No murmur heard. No friction rub. No gallop.   Pulmonary:     Effort: Pulmonary effort is normal. No respiratory distress.     Breath sounds: Normal breath sounds. No wheezing or rales.  Musculoskeletal:     Left shoulder: Normal. No tenderness, bony tenderness or crepitus. Normal range of motion. Normal strength.     Cervical back: No spasms, tenderness or bony tenderness. No pain with movement. Decreased range of motion.       Back:          Assessment & Plan:  Cervical radiculopathy  OAB (overactive bladder)  Try prednisone taper pack for the cervical radiculopathy.  Reassess in 1 week and if no better proceed with x-ray imaging of the neck.  Suspect herniated disc in the cervical spine.  Try Vesicare 10 mg a day for overactive bladder.  Patient declines pelvic exam today to evaluate for prolapsed bladder

## 2020-01-25 NOTE — Telephone Encounter (Signed)
Pt called to advised that her pharmacy had no gotten the scripts from today appointment. LVM advising both scripts were confirmed by pharmacy and she should try again. KH

## 2020-02-01 ENCOUNTER — Other Ambulatory Visit: Payer: Self-pay | Admitting: Family Medicine

## 2020-02-01 ENCOUNTER — Telehealth: Payer: Self-pay | Admitting: *Deleted

## 2020-02-01 DIAGNOSIS — M5412 Radiculopathy, cervical region: Secondary | ICD-10-CM

## 2020-02-01 MED ORDER — OXYBUTYNIN CHLORIDE ER 10 MG PO TB24: 10.0000 mg | ORAL_TABLET | Freq: Every day | ORAL | 1 refills | Status: DC

## 2020-02-01 NOTE — Telephone Encounter (Signed)
Orders placed.   Call placed to patient and patient made aware.  

## 2020-02-01 NOTE — Telephone Encounter (Signed)
Received call from patient.   Reports that she has completed prednisone taper, but shoulder pain/ tingling has not improved.   Per chart note on 01/25/2020: Try prednisone taper pack for the cervical radiculopathy.  Reassess in 1 week and if no better proceed with x-ray imaging of the neck.  Suspect herniated disc in the cervical spine.  Ok to order X-ray?

## 2020-02-01 NOTE — Telephone Encounter (Signed)
Call placed to patient. LMTRC.  

## 2020-02-01 NOTE — Telephone Encounter (Signed)
Received fax requesting alternative to vesicare as medication is not covered by insurance.   Preferred alternatives include: Oxybutynin Detrol Trospium  Please advise.

## 2020-02-01 NOTE — Telephone Encounter (Signed)
Call placed to patient and patient made aware.  

## 2020-02-01 NOTE — Telephone Encounter (Signed)
I will send in oxybutynin 5 mg pobid.

## 2020-02-01 NOTE — Telephone Encounter (Signed)
ok 

## 2020-02-02 ENCOUNTER — Ambulatory Visit
Admission: RE | Admit: 2020-02-02 | Discharge: 2020-02-02 | Disposition: A | Discharge status: Home / Self Care | Source: Ambulatory Visit | Attending: Family Medicine | Admitting: Family Medicine

## 2020-02-02 DIAGNOSIS — M5412 Radiculopathy, cervical region: Secondary | ICD-10-CM

## 2020-02-05 ENCOUNTER — Other Ambulatory Visit: Payer: Self-pay | Admitting: Family Medicine

## 2020-02-05 DIAGNOSIS — M5412 Radiculopathy, cervical region: Secondary | ICD-10-CM

## 2020-02-08 ENCOUNTER — Other Ambulatory Visit: Payer: Self-pay | Admitting: Family Medicine

## 2020-02-08 MED ORDER — HYDROCODONE-ACETAMINOPHEN 5-325 MG PO TABS
1.0000 | ORAL_TABLET | Freq: Four times a day (QID) | ORAL | 0 refills | Status: DC | PRN
Start: 2020-02-08 — End: 2020-08-09

## 2020-02-14 ENCOUNTER — Other Ambulatory Visit: Payer: Self-pay

## 2020-02-14 ENCOUNTER — Ambulatory Visit
Admission: RE | Admit: 2020-02-14 | Discharge: 2020-02-14 | Disposition: A | Discharge status: Home / Self Care | Source: Ambulatory Visit | Attending: Obstetrics and Gynecology | Admitting: Obstetrics and Gynecology

## 2020-02-14 ENCOUNTER — Other Ambulatory Visit: Payer: Self-pay | Admitting: Obstetrics and Gynecology

## 2020-02-14 DIAGNOSIS — R921 Mammographic calcification found on diagnostic imaging of breast: Secondary | ICD-10-CM

## 2020-02-14 DIAGNOSIS — R928 Other abnormal and inconclusive findings on diagnostic imaging of breast: Secondary | ICD-10-CM

## 2020-02-23 ENCOUNTER — Other Ambulatory Visit: Payer: Self-pay

## 2020-02-23 ENCOUNTER — Ambulatory Visit
Admission: RE | Admit: 2020-02-23 | Discharge: 2020-02-23 | Disposition: A | Discharge status: Home / Self Care | Source: Ambulatory Visit | Attending: Family Medicine | Admitting: Family Medicine

## 2020-02-23 DIAGNOSIS — M5412 Radiculopathy, cervical region: Secondary | ICD-10-CM

## 2020-04-11 ENCOUNTER — Other Ambulatory Visit: Payer: Self-pay | Admitting: Family Medicine

## 2020-06-13 ENCOUNTER — Other Ambulatory Visit: Payer: Self-pay | Admitting: Family Medicine

## 2020-08-09 ENCOUNTER — Encounter: Payer: Self-pay | Admitting: Family Medicine

## 2020-08-09 ENCOUNTER — Ambulatory Visit (INDEPENDENT_AMBULATORY_CARE_PROVIDER_SITE_OTHER): Admitting: Family Medicine

## 2020-08-09 ENCOUNTER — Other Ambulatory Visit: Payer: Self-pay

## 2020-08-09 VITALS — BP 138/72 | HR 108 | Temp 98.4°F | Resp 16 | Ht 64.0 in | Wt 143.0 lb

## 2020-08-09 DIAGNOSIS — K219 Gastro-esophageal reflux disease without esophagitis: Secondary | ICD-10-CM

## 2020-08-09 MED ORDER — PANTOPRAZOLE SODIUM 40 MG PO TBEC: 40.0000 mg | DELAYED_RELEASE_TABLET | Freq: Two times a day (BID) | ORAL | 3 refills | Status: DC

## 2020-08-09 MED ORDER — SUCRALFATE 1 G PO TABS: 1.0000 g | ORAL_TABLET | Freq: Three times a day (TID) | ORAL | 0 refills | Status: DC

## 2020-08-09 NOTE — Progress Notes (Signed)
Subjective:    Patient ID: Tammy Melton, female    DOB: 02-17-1970, 50 y.o.   MRN: [id number]  HPI Patient has been taking Nexium 20 mg a day.  She even added Pepcid over the counter.  Despite doing this, she is having heartburn per tickly every night when she is laying down in bed.  She will wake up around 4 in the morning and feeling a burning sensation in her chest and up in her throat.  During the day is not as bad but she continues to have a burning sensation at times.  She has tried cutting back on her spicy food which is helped some.  She denies any chest pain with activity.  She denies any shortness of breath.  She denies any dyspnea on exertion.  She denies any melena or hematochezia.  She denies any fevers or chills or weight loss or cough or pleurisy. Past Medical History:  Diagnosis Date   Anemia    Depression    High blood pressure    h/o   Migraines    Past Surgical History:  Procedure Laterality Date   CESAREAN SECTION     TUBAL LIGATION     Current Outpatient Medications on File Prior to Visit  Medication Sig Dispense Refill   levonorgestrel (MIRENA, 52 MG,) 20 MCG/DAY IUD Mirena 20 mcg/24 hours (7 yrs) 52 mg intrauterine device  Take 1 device every day by intrauterine route.     oxybutynin (DITROPAN-XL) 10 MG 24 hr tablet TAKE 1 TABLET(10 MG) BY MOUTH AT BEDTIME 30 tablet 3   No current facility-administered medications on file prior to visit.   No Known Allergies Social History   Socioeconomic History   Marital status: Married    Spouse name: Not on file   Number of children: Not on file   Years of education: Not on file   Highest education level: Not on file  Occupational History   Not on file  Tobacco Use   Smoking status: Never   Smokeless tobacco: Never  Substance and Sexual Activity   Alcohol use: No   Drug use: No   Sexual activity: Not on file  Other Topics Concern   Not on file  Social History Narrative   Not on file   Social  Determinants of Health   Financial Resource Strain: Not on file  Food Insecurity: Not on file  Transportation Needs: Not on file  Physical Activity: Not on file  Stress: Not on file  Social Connections: Not on file  Intimate Partner Violence: Not on file     Review of Systems     Objective:   Physical Exam HENT:     Mouth/Throat:     Pharynx: No oropharyngeal exudate.  Eyes:     Conjunctiva/sclera: Conjunctivae normal.  Neck:     Thyroid: No thyromegaly.     Vascular: No JVD.  Musculoskeletal:     Cervical back: Neck supple.  Lymphadenopathy:     Cervical: No cervical adenopathy.  Skin:    General: Skin is warm.     Coloration: Skin is not pale.     Findings: No erythema or rash.  Neurological:     Mental Status: She is alert and oriented to person, place, and time.     Cranial Nerves: No cranial nerve deficit.     Motor: No abnormal muscle tone.     Coordination: Coordination normal.     Deep Tendon Reflexes: Reflexes are normal and symmetric. Reflexes  normal.  Psychiatric:        Behavior: Behavior normal.        Thought Content: Thought content normal.        Judgment: Judgment normal.         Assessment & Plan:   Gastroesophageal reflux disease, unspecified whether esophagitis present I suspect the patient may have reflux esophagitis.  Begin by starting the patient on Protonix 40 mg twice daily and adding sucralfate 1 g p.o. q. ACH S.  Elevate the head of the bed 2 inches.  Reassess in 4 weeks.  If improving at that point I would reduce treatment to Protonix 40 mg once a day.  If not improving at that point I would recommend EGD.  Recheck sooner if worsening

## 2020-08-16 ENCOUNTER — Other Ambulatory Visit: Payer: Self-pay | Admitting: Obstetrics and Gynecology

## 2020-08-16 ENCOUNTER — Other Ambulatory Visit: Payer: Self-pay

## 2020-08-16 ENCOUNTER — Ambulatory Visit
Admission: RE | Admit: 2020-08-16 | Discharge: 2020-08-16 | Disposition: A | Discharge status: Home / Self Care | Source: Ambulatory Visit | Attending: Obstetrics and Gynecology | Admitting: Obstetrics and Gynecology

## 2020-08-16 DIAGNOSIS — R921 Mammographic calcification found on diagnostic imaging of breast: Secondary | ICD-10-CM

## 2020-08-23 ENCOUNTER — Encounter: Admitting: Family Medicine

## 2020-09-09 ENCOUNTER — Other Ambulatory Visit: Payer: Self-pay | Admitting: Family Medicine

## 2020-09-17 ENCOUNTER — Ambulatory Visit (INDEPENDENT_AMBULATORY_CARE_PROVIDER_SITE_OTHER): Admitting: Family Medicine

## 2020-09-17 ENCOUNTER — Other Ambulatory Visit: Payer: Self-pay

## 2020-09-17 ENCOUNTER — Encounter: Payer: Self-pay | Admitting: Family Medicine

## 2020-09-17 VITALS — BP 126/84 | HR 116 | Ht 64.0 in | Wt 145.6 lb

## 2020-09-17 DIAGNOSIS — Z0001 Encounter for general adult medical examination with abnormal findings: Secondary | ICD-10-CM

## 2020-09-17 DIAGNOSIS — E049 Nontoxic goiter, unspecified: Secondary | ICD-10-CM

## 2020-09-17 DIAGNOSIS — Z1211 Encounter for screening for malignant neoplasm of colon: Secondary | ICD-10-CM

## 2020-09-17 DIAGNOSIS — Z0000 Encounter for general adult medical examination without abnormal findings: Secondary | ICD-10-CM

## 2020-09-17 NOTE — Progress Notes (Signed)
Subjective:    Patient ID: Tammy Melton, female    DOB: 1970-04-08, 50 y.o.   MRN: [id number]  HPI Patient is here today for complete physical exam.   Mammogram 8/22 Pap (nml)-06/2018 Colonoscopy-due Patient has suffered with indigestion off and on for quite some time.  She states as long she takes the Protonix, and indigestion does not occur however soon as she stops the medication, the indigestion will return.  She is at an age now where she is due for colonoscopy.  She is interested in possibly having an EGD to rule out H. pylori.  Her sister was just diagnosed with H. pylori.  We discussed the breath test today versus an EGD and given her abdominal pain and reflux, I feel that the EGD may be warranted.  Therefore I would like her to see GI to discuss this in addition to her colonoscopy.  Otherwise she is doing well.  She prefers to get her flu shot at work.  She is not sure about her last tetanus shot and she has not had a COVID booster.  We discussed this as well today. Past Medical History:  Diagnosis Date   Anemia    Depression    High blood pressure    h/o   Migraines    Past Surgical History:  Procedure Laterality Date   CESAREAN SECTION     TUBAL LIGATION     Current Outpatient Medications on File Prior to Visit  Medication Sig Dispense Refill   levonorgestrel (MIRENA, 52 MG,) 20 MCG/DAY IUD Mirena 20 mcg/24 hours (7 yrs) 52 mg intrauterine device  Take 1 device every day by intrauterine route.     oxybutynin (DITROPAN-XL) 10 MG 24 hr tablet TAKE 1 TABLET(10 MG) BY MOUTH AT BEDTIME 30 tablet 3   pantoprazole (PROTONIX) 40 MG tablet Take 1 tablet (40 mg total) by mouth 2 (two) times daily. 60 tablet 3   sucralfate (CARAFATE) 1 g tablet TAKE 1 TABLET(1 GRAM) BY MOUTH FOUR TIMES DAILY AT BEDTIME WITH MEALS 120 tablet 0   No current facility-administered medications on file prior to visit.   No Known Allergies Social History   Socioeconomic History   Marital status:  Married    Spouse name: Not on file   Number of children: Not on file   Years of education: Not on file   Highest education level: Not on file  Occupational History   Not on file  Tobacco Use   Smoking status: Never   Smokeless tobacco: Never  Substance and Sexual Activity   Alcohol use: No   Drug use: No   Sexual activity: Not on file  Other Topics Concern   Not on file  Social History Narrative   Not on file   Social Determinants of Health   Financial Resource Strain: Not on file  Food Insecurity: Not on file  Transportation Needs: Not on file  Physical Activity: Not on file  Stress: Not on file  Social Connections: Not on file  Intimate Partner Violence: Not on file     Review of Systems  All other systems reviewed and are negative.     Objective:   Physical Exam Vitals reviewed.  Constitutional:      General: She is not in acute distress.    Appearance: She is well-developed. She is not diaphoretic.  HENT:     Head: Normocephalic.     Right Ear: Tympanic membrane, ear canal and external ear normal.  Left Ear: Tympanic membrane, ear canal and external ear normal.     Nose: Nose normal. No congestion or rhinorrhea.     Mouth/Throat:     Mouth: Mucous membranes are moist.     Pharynx: Oropharynx is clear. No oropharyngeal exudate.  Eyes:     General: No scleral icterus.    Conjunctiva/sclera: Conjunctivae normal.  Neck:     Thyroid: Thyromegaly present.     Vascular: No JVD.  Cardiovascular:     Rate and Rhythm: Normal rate and regular rhythm.     Heart sounds: Normal heart sounds. No murmur heard.   No friction rub. No gallop.  Pulmonary:     Effort: Pulmonary effort is normal. No respiratory distress.     Breath sounds: Normal breath sounds. No stridor. No wheezing, rhonchi or rales.  Chest:     Chest wall: No tenderness.  Abdominal:     General: Abdomen is flat. Bowel sounds are normal. There is no distension.     Palpations: Abdomen is soft.  There is no mass.     Tenderness: There is no abdominal tenderness. There is no guarding or rebound.  Musculoskeletal:     Cervical back: Neck supple.  Lymphadenopathy:     Cervical: No cervical adenopathy.  Skin:    General: Skin is warm.     Coloration: Skin is not pale.     Findings: No erythema or rash.  Neurological:     Mental Status: She is alert and oriented to person, place, and time.     Cranial Nerves: No cranial nerve deficit.     Motor: No abnormal muscle tone.     Coordination: Coordination normal.     Deep Tendon Reflexes: Reflexes are normal and symmetric. Reflexes normal.  Psychiatric:        Behavior: Behavior normal.        Thought Content: Thought content normal.        Judgment: Judgment normal.         Assessment & Plan:   General medical exam - Plan: CBC with Differential/Platelet, COMPLETE METABOLIC PANEL WITH GFR, Lipid panel, Ambulatory referral to Gastroenterology  Colon cancer screening - Plan: Ambulatory referral to Gastroenterology  Goiter - Plan: TSH Physical exam today is significant just for tachycardia.  The patient has whitecoat syndrome so I think that this is the reason for that however I will check a CBC to rule out anemia.  I will also check a TSH.  She does have a slight goiter on exam I did not appreciate any nodularity.  However I will check a TSH to rule out hypothyroidism.  Also check a CMP and a lipid panel.  Mammogram and Pap smear are up-to-date.  I will consult GI for a colonoscopy for colon cancer screening.  I also asked the patient to discuss with him possibly doing an EGD given her persistent GERD.  If she decides not to do that, we could always do an H. pylori breath test here.

## 2020-09-18 LAB — COMPLETE METABOLIC PANEL WITH GFR
AG Ratio: 1.8 (calc) (ref 1.0–2.5)
ALT: 8 U/L (ref 6–29)
AST: 13 U/L (ref 10–35)
Albumin: 4.6 g/dL (ref 3.6–5.1)
Alkaline phosphatase (APISO): 41 U/L (ref 31–125)
BUN: 11 mg/dL (ref 7–25)
CO2: 24 mmol/L (ref 20–32)
Calcium: 9.6 mg/dL (ref 8.6–10.2)
Chloride: 106 mmol/L (ref 98–110)
Creat: 0.75 mg/dL (ref 0.50–0.99)
Globulin: 2.5 g/dL (calc) (ref 1.9–3.7)
Glucose, Bld: 95 mg/dL (ref 65–99)
Potassium: 4 mmol/L (ref 3.5–5.3)
Sodium: 139 mmol/L (ref 135–146)
Total Bilirubin: 0.5 mg/dL (ref 0.2–1.2)
Total Protein: 7.1 g/dL (ref 6.1–8.1)
eGFR: 99 mL/min/{1.73_m2} (ref >=60)

## 2020-09-18 LAB — CBC WITH DIFFERENTIAL/PLATELET
Absolute Monocytes: 341 cells/uL (ref 200–950)
Basophils Absolute: 91 cells/uL (ref 0–200)
Basophils Relative: 1.9 %
Eosinophils Absolute: 130 cells/uL (ref 15–500)
Eosinophils Relative: 2.7 %
HCT: 44.9 % (ref 35.0–45.0)
Hemoglobin: 14.4 g/dL (ref 11.7–15.5)
Lymphs Abs: 1061 cells/uL (ref 850–3900)
MCH: 28.5 pg (ref 27.0–33.0)
MCHC: 32.1 g/dL (ref 32.0–36.0)
MCV: 88.7 fL (ref 80.0–100.0)
MPV: 11.8 fL (ref 7.5–12.5)
Monocytes Relative: 7.1 %
Neutro Abs: 3178 cells/uL (ref 1500–7800)
Neutrophils Relative %: 66.2 %
Platelets: 251 10*3/uL (ref 140–400)
RBC: 5.06 10*6/uL (ref 3.80–5.10)
RDW: 13.1 % (ref 11.0–15.0)
Total Lymphocyte: 22.1 %
WBC: 4.8 10*3/uL (ref 3.8–10.8)

## 2020-09-18 LAB — LIPID PANEL
Cholesterol: 169 mg/dL (ref <200)
HDL: 40 mg/dL — ABNORMAL LOW (ref >=50)
LDL Cholesterol (Calc): 115 mg/dL (calc) — ABNORMAL HIGH
Non-HDL Cholesterol (Calc): 129 mg/dL (calc) (ref <130)
Total CHOL/HDL Ratio: 4.2 (calc) (ref <5.0)
Triglycerides: 59 mg/dL (ref <150)

## 2020-09-18 LAB — TSH: TSH: 1.18 mIU/L

## 2020-09-19 ENCOUNTER — Telehealth: Payer: Self-pay

## 2020-09-19 NOTE — Telephone Encounter (Signed)
Pt. Returning call, ready to schedule screening

## 2020-09-20 ENCOUNTER — Telehealth: Payer: Self-pay

## 2020-09-20 NOTE — Telephone Encounter (Signed)
Pt. Ready to schedule procedure.. 

## 2020-09-20 NOTE — Telephone Encounter (Signed)
Pt. Calling back to schedule her EGD and Colonoscopy.Marland KitchenMarland Kitchen

## 2020-09-23 ENCOUNTER — Telehealth: Payer: Self-pay

## 2020-09-23 NOTE — Telephone Encounter (Signed)
Called patient no answer.

## 2020-09-23 NOTE — Telephone Encounter (Signed)
Called patient no answer letter being sent

## 2020-09-24 ENCOUNTER — Other Ambulatory Visit: Payer: Self-pay

## 2020-10-15 ENCOUNTER — Other Ambulatory Visit: Payer: Self-pay | Admitting: Family Medicine

## 2020-11-14 ENCOUNTER — Other Ambulatory Visit: Payer: Self-pay | Admitting: Family Medicine

## 2020-11-18 ENCOUNTER — Other Ambulatory Visit: Payer: Self-pay

## 2020-11-18 ENCOUNTER — Encounter: Payer: Self-pay | Admitting: Gastroenterology

## 2020-11-18 ENCOUNTER — Ambulatory Visit (INDEPENDENT_AMBULATORY_CARE_PROVIDER_SITE_OTHER): Admitting: Gastroenterology

## 2020-11-18 VITALS — BP 179/78 | HR 120 | Temp 98.7°F | Ht 64.0 in | Wt 146.2 lb

## 2020-11-18 DIAGNOSIS — K219 Gastro-esophageal reflux disease without esophagitis: Secondary | ICD-10-CM

## 2020-11-18 DIAGNOSIS — Z1211 Encounter for screening for malignant neoplasm of colon: Secondary | ICD-10-CM

## 2020-11-18 MED ORDER — CLENPIQ 10-3.5-12 MG-GM -GM/160ML PO SOLN: 1.0000 | ORAL | 0 refills | Status: DC

## 2020-11-18 NOTE — Patient Instructions (Signed)

## 2020-11-18 NOTE — Progress Notes (Signed)
Cephas Darby, MD [address]  [place]  [place], [place] [address]  Main: [phone]  Fax: [phone]    Gastroenterology Consultation  Referring Provider:     [name], MD Primary Care Physician:  [name], MD Primary Gastroenterologist:  Dr. Cephas Darby Reason for Consultation:   Heartburn        HPI:   Tammy Melton is a 49 y.o. female referred by Dr. [name], MD  for consultation & management of heartburn.  Patient reports that she has been experiencing burning in her chest radiating to both sides of her chest associated with some epigastric discomfort.  This has started in May.  She was taking over-the-counter Nexium which helped with her symptoms.  Her heartburn would return when she stops taking medication for 2 days.  After she has seen her PCP, she was started on Protonix 40 mg twice daily and sucralfate 1 g 4 times a day.  She also reports difficulty swallowing when she is not on PPI.  Patient denies any other symptoms.  She does have regular bowel movements.  She denies nausea or vomiting.  She does report some regurgitation.  She used to have nocturnal symptoms which resolved since taking PPI.  Patient gained about 10 pounds within last 1 year  Patient does not smoke or drink alcohol She works in a childcare full day  NSAIDs: None  Antiplts/Anticoagulants/Anti thrombotics: None  GI Procedures: None She denies family history of GI malignancy  Past Medical History:  Diagnosis Date   Anemia    Depression    High blood pressure    h/o   Migraines     Past Surgical History:  Procedure Laterality Date   CESAREAN SECTION     TUBAL LIGATION      Current Outpatient Medications:    levonorgestrel (MIRENA, 52 MG,) 20 MCG/DAY IUD, Mirena 20 mcg/24 hours (8 yrs) 52 mg intrauterine device  Take 1 device every day by intrauterine route., Disp: , Rfl:    oxybutynin (DITROPAN-XL) 10 MG 24 hr tablet, TAKE 1 TABLET(10 MG) BY  MOUTH AT BEDTIME, Disp: 30 tablet, Rfl: 3   pantoprazole (PROTONIX) 40 MG tablet, Take 1 tablet (40 mg total) by mouth 2 (two) times daily., Disp: 60 tablet, Rfl: 3   Sod Picosulfate-Mag Ox-Cit Acd (CLENPIQ) 10-3.5-12 MG-GM -GM/160ML SOLN, Take 1 kit by mouth as directed. At 5 PM evening before procedure, drink 1 bottle of Clenpiq, hydrate, drink (5) 8 oz of water. Then do the same thing 5 hours prior to your procedure., Disp: 320 mL, Rfl: 0   sucralfate (CARAFATE) 1 g tablet, TAKE 1 TABLET(1 GRAM) BY MOUTH FOUR TIMES DAILY AT BEDTIME WITH MEALS, Disp: 120 tablet, Rfl: 0   levonorgestrel (MIRENA, 52 MG,) 20 MCG/DAY IUD, Mirena 20 mcg/24 hours (7 yrs) 52 mg intrauterine device  Take 1 device every day by intrauterine route., Disp: , Rfl:     Family History  Problem Relation Age of Onset   Breast cancer Cousin      Social History   Tobacco Use   Smoking status: Never   Smokeless tobacco: Never  Substance Use Topics   Alcohol use: No   Drug use: No    Allergies as of 11/18/2020   (No Known Allergies)    Review of Systems:    All systems reviewed and negative except where noted in HPI.   Physical Exam:  BP (!) 179/78 (BP Location: Right Arm,  Patient Position: Sitting, Cuff Size: Normal)   Pulse (!) 120   Temp 98.7 F (37.1 C) (Oral)   Ht 5' 4" (1.626 m)   Wt 146 lb 3.2 oz (66.3 kg)   BMI 25.10 kg/m  No LMP recorded. (Menstrual status: IUD).  General:   Alert,  Well-developed, well-nourished, pleasant and cooperative in NAD Head:  Normocephalic and atraumatic. Eyes:  Sclera clear, no icterus.   Conjunctiva pink. Ears:  Normal auditory acuity. Nose:  No deformity, discharge, or lesions. Mouth:  No deformity or lesions,oropharynx pink & moist. Neck:  Supple; no masses or thyromegaly. Lungs:  Respirations even and unlabored.  Clear throughout to auscultation.   No wheezes, crackles, or rhonchi. No acute distress. Heart:  Regular rate and rhythm; no murmurs, clicks, rubs, or  gallops. Abdomen:  Normal bowel sounds. Soft, non-tender and mildly distended, tympanic without masses, hepatosplenomegaly or hernias noted.  No guarding or rebound tenderness.   Rectal: Not performed Msk:  Symmetrical without gross deformities. Good, equal movement & strength bilaterally. Pulses:  Normal pulses noted. Extremities:  No clubbing or edema.  No cyanosis. Neurologic:  Alert and oriented x3;  grossly normal neurologically. Skin:  Intact without significant lesions or rashes. No jaundice. Psych:  Alert and cooperative. Normal mood and affect.  Imaging Studies: Reviewed  Assessment and Plan:   Tammy Melton is a 50 y.o. pleasant African-American female with no significant past medical history seen in consultation for approximately 6 months history of heartburn, regurgitation, currently on PPI, unable to wean off PPI  Chronic heartburn Continue Protonix 40 mg p.o. twice daily before meals Okay to stop sucralfate Discussed about antireflux lifestyle, information provided Recommend EGD for further evaluation Recommend gastric biopsies to rule out H. pylori  Colon cancer screening Recommend colonoscopy  I have discussed alternative options, risks & benefits,  which include, but are not limited to, bleeding, infection, perforation,respiratory complication & drug reaction.  The patient agrees with this plan & written consent will be obtained.     Follow up after the EGD   Cephas Darby, MD

## 2020-12-11 ENCOUNTER — Encounter: Payer: Self-pay | Admitting: Gastroenterology

## 2020-12-12 ENCOUNTER — Ambulatory Visit: Admitting: Anesthesiology

## 2020-12-12 ENCOUNTER — Encounter
Admission: RE | Disposition: A | Discharge status: Home / Self Care | Payer: Self-pay | Source: Ambulatory Visit | Attending: Gastroenterology

## 2020-12-12 ENCOUNTER — Other Ambulatory Visit: Payer: Self-pay

## 2020-12-12 ENCOUNTER — Encounter: Payer: Self-pay | Admitting: Gastroenterology

## 2020-12-12 ENCOUNTER — Ambulatory Visit
Admission: RE | Admit: 2020-12-12 | Discharge: 2020-12-12 | Disposition: A | Discharge status: Home / Self Care | Source: Ambulatory Visit | Attending: Gastroenterology | Admitting: Gastroenterology

## 2020-12-12 DIAGNOSIS — R519 Headache, unspecified: Secondary | ICD-10-CM | POA: Diagnosis not present

## 2020-12-12 DIAGNOSIS — K3189 Other diseases of stomach and duodenum: Secondary | ICD-10-CM | POA: Insufficient documentation

## 2020-12-12 DIAGNOSIS — K219 Gastro-esophageal reflux disease without esophagitis: Secondary | ICD-10-CM

## 2020-12-12 DIAGNOSIS — Z1211 Encounter for screening for malignant neoplasm of colon: Secondary | ICD-10-CM | POA: Diagnosis present

## 2020-12-12 DIAGNOSIS — K319 Disease of stomach and duodenum, unspecified: Secondary | ICD-10-CM | POA: Diagnosis not present

## 2020-12-12 DIAGNOSIS — R1013 Epigastric pain: Secondary | ICD-10-CM | POA: Insufficient documentation

## 2020-12-12 DIAGNOSIS — I10 Essential (primary) hypertension: Secondary | ICD-10-CM | POA: Insufficient documentation

## 2020-12-12 DIAGNOSIS — F32A Depression, unspecified: Secondary | ICD-10-CM | POA: Insufficient documentation

## 2020-12-12 DIAGNOSIS — K2950 Unspecified chronic gastritis without bleeding: Secondary | ICD-10-CM | POA: Diagnosis not present

## 2020-12-12 HISTORY — PX: COLONOSCOPY: SHX5424

## 2020-12-12 HISTORY — PX: ESOPHAGOGASTRODUODENOSCOPY (EGD) WITH PROPOFOL: SHX5813

## 2020-12-12 HISTORY — DX: Gastro-esophageal reflux disease without esophagitis: K21.9

## 2020-12-12 LAB — POCT PREGNANCY, URINE: Preg Test, Ur: NEGATIVE

## 2020-12-12 SURGERY — ESOPHAGOGASTRODUODENOSCOPY (EGD) WITH PROPOFOL
Anesthesia: General

## 2020-12-12 MED ORDER — LIDOCAINE HCL (CARDIAC) PF 100 MG/5ML IV SOSY
PREFILLED_SYRINGE | INTRAVENOUS | Status: DC | PRN
  Administered 2020-12-12: 50 mg via INTRAVENOUS

## 2020-12-12 MED ORDER — PROPOFOL 10 MG/ML IV BOLUS
INTRAVENOUS | Status: AC
  Filled 2020-12-12: qty 20

## 2020-12-12 MED ORDER — SODIUM CHLORIDE 0.9 % IV SOLN: INTRAVENOUS | Status: DC

## 2020-12-12 MED ORDER — PROPOFOL 500 MG/50ML IV EMUL
INTRAVENOUS | Status: DC | PRN
  Administered 2020-12-12: 150 ug/kg/min via INTRAVENOUS

## 2020-12-12 MED ORDER — LIDOCAINE HCL (PF) 2 % IJ SOLN
INTRAMUSCULAR | Status: AC
  Filled 2020-12-12: qty 5

## 2020-12-12 MED ORDER — PROPOFOL 500 MG/50ML IV EMUL
INTRAVENOUS | Status: AC
  Filled 2020-12-12: qty 50

## 2020-12-12 NOTE — Op Note (Signed)
[facility] Gastroenterology Patient Name: Tammy Melton Procedure Date: 12/12/2020 9:20 AM MRN: [id number] Account #: [id number] Date of Birth: March 08, 1970 Admit Type: Outpatient Age: 50 Room: [facility] ENDO ROOM 3 Gender: Female Note Status: Finalized Instrument Name: Prentice Docker [id number] Procedure:             Colonoscopy Indications:           Screening for colorectal malignant neoplasm, This is                         the patient's first colonoscopy Providers:             Toney Reil MD, MD Referring MD:          Priscille Heidelberg. Pickard (Referring MD) Medicines:             General Anesthesia Complications:         No immediate complications. Estimated blood loss: None. Procedure:             Pre-Anesthesia Assessment:                        - Prior to the procedure, a History and Physical was                         performed, and patient medications and allergies were                         reviewed. The patient is competent. The risks and                         benefits of the procedure and the sedation options and                         risks were discussed with the patient. All questions                         were answered and informed consent was obtained.                         Patient identification and proposed procedure were                         verified by the physician, the nurse, the                         anesthesiologist, the anesthetist and the technician                         in the pre-procedure area in the procedure room in the                         endoscopy suite. Mental Status Examination: alert and                         oriented. Airway Examination: normal oropharyngeal                         airway and neck mobility. Respiratory Examination:  clear to auscultation. CV Examination: normal.                         Prophylactic Antibiotics: The patient does not require                          prophylactic antibiotics. Prior Anticoagulants: The                         patient has taken no previous anticoagulant or                         antiplatelet agents. ASA Grade Assessment: II - A                         patient with mild systemic disease. After reviewing                         the risks and benefits, the patient was deemed in                         satisfactory condition to undergo the procedure. The                         anesthesia plan was to use general anesthesia.                         Immediately prior to administration of medications,                         the patient was re-assessed for adequacy to receive                         sedatives. The heart rate, respiratory rate, oxygen                         saturations, blood pressure, adequacy of pulmonary                         ventilation, and response to care were monitored                         throughout the procedure. The physical status of the                         patient was re-assessed after the procedure.                        After obtaining informed consent, the colonoscope was                         passed under direct vision. Throughout the procedure,                         the patient's blood pressure, pulse, and oxygen                         saturations were monitored continuously. The  Colonoscope was introduced through the anus and                         advanced to the the terminal ileum, with                         identification of the appendiceal orifice and IC                         valve. The colonoscopy was performed without                         difficulty. The patient tolerated the procedure well.                         The quality of the bowel preparation was evaluated                         using the BBPS [facility] Bowel Preparation Scale) with                         scores of: Right Colon = 3, Transverse Colon = 3 and                          Left Colon = 3 (entire mucosa seen well with no                         residual staining, small fragments of stool or opaque                         liquid). The total BBPS score equals 9. Findings:      The perianal and digital rectal examinations were normal. Pertinent       negatives include normal sphincter tone and no palpable rectal lesions.      The terminal ileum appeared normal.      The entire examined colon appeared normal.      The retroflexed view of the distal rectum and anal verge was normal and       showed no anal or rectal abnormalities. Impression:            - The examined portion of the ileum was normal.                        - The entire examined colon is normal.                        - The distal rectum and anal verge are normal on                         retroflexion view.                        - No specimens collected. Recommendation:        - Discharge patient to home (with escort).                        - Resume previous diet today.                        -  Continue present medications.                        - Repeat colonoscopy in 10 years for screening                         purposes. Procedure Code(s):     --- Professional ---                        Q4696, Colorectal cancer screening; colonoscopy on                         individual not meeting criteria for high risk Diagnosis Code(s):     --- Professional ---                        Z12.11, Encounter for screening for malignant neoplasm                         of colon CPT copyright 2019 American Medical Association. All rights reserved. The codes documented in this report are preliminary and upon coder review may  be revised to meet current compliance requirements. Dr. Libby Maw Toney Reil MD, MD 12/12/2020 9:55:49 AM This report has been signed electronically. Number of Addenda: 0 Note Initiated On: 12/12/2020 9:20 AM Scope Withdrawal Time: 0 hours 8 minutes 57 seconds  Total  Procedure Duration: 0 hours 12 minutes 46 seconds  Estimated Blood Loss:  Estimated blood loss: none.      [facility]

## 2020-12-12 NOTE — H&P (Signed)
Tammy Darby, MD [address]  [place]  [place], [place] [address]  Main: [phone]  Fax: [phone] Pager: [phone]  Primary Care Physician:  [name], MD Primary Gastroenterologist:  Dr. Cephas Melton  Pre-Procedure History & Physical: HPI:  Tammy Melton is a 50 y.o. female is here for an endoscopy and colonoscopy.   Past Medical History:  Diagnosis Date   Anemia    Depression    GERD (gastroesophageal reflux disease)    High blood pressure    h/o   Migraines     Past Surgical History:  Procedure Laterality Date   CESAREAN SECTION     TUBAL LIGATION      Prior to Admission medications   Medication Sig Start Date End Date Taking? Authorizing Provider  levonorgestrel (MIRENA, 52 MG,) 20 MCG/DAY IUD Mirena 20 mcg/24 hours (8 yrs) 52 mg intrauterine device  Take 1 device every day by intrauterine route.   Yes [provider]  pantoprazole (PROTONIX) 40 MG tablet Take 1 tablet (40 mg total) by mouth 2 (two) times daily. 08/09/20  Yes [name], MD  sucralfate (CARAFATE) 1 g tablet TAKE 1 TABLET(1 GRAM) BY MOUTH FOUR TIMES DAILY AT BEDTIME WITH MEALS 11/14/20  Yes [name], MD  oxybutynin (DITROPAN-XL) 10 MG 24 hr tablet TAKE 1 TABLET(10 MG) BY MOUTH AT BEDTIME 10/15/20   [name], MD  Sod Picosulfate-Mag Ox-Cit Acd (CLENPIQ) 10-3.5-12 MG-GM -GM/160ML SOLN Take 1 kit by mouth as directed. At 5 PM evening before procedure, drink 1 bottle of Clenpiq, hydrate, drink (5) 8 oz of water. Then do the same thing 5 hours prior to your procedure. Patient not taking: Reported on 12/12/2020 11/18/20   Lin Landsman, MD    Allergies as of 11/19/2020   (No Known Allergies)    Family History  Problem Relation Age of Onset   Breast cancer Cousin     Social History   Socioeconomic History   Marital status: Married    Spouse name: Not on file   Number of children: Not on file   Years of education: Not on file    Highest education level: Not on file  Occupational History   Not on file  Tobacco Use   Smoking status: Never   Smokeless tobacco: Never  Vaping Use   Vaping Use: Never used  Substance and Sexual Activity   Alcohol use: No   Drug use: No   Sexual activity: Not on file  Other Topics Concern   Not on file  Social History Narrative   Not on file   Social Determinants of Health   Financial Resource Strain: Not on file  Food Insecurity: Not on file  Transportation Needs: Not on file  Physical Activity: Not on file  Stress: Not on file  Social Connections: Not on file  Intimate Partner Violence: Not on file    Review of Systems: See HPI, otherwise negative ROS  Physical Exam: BP (!) 151/74   Pulse (!) 112   Temp (!) 97.1 F (36.2 C) (Temporal)   Resp 20   Ht 5' 4" (1.626 m)   Wt 63.5 kg   SpO2 100%   BMI 24.03 kg/m  General:   Alert,  pleasant and cooperative in NAD Head:  Normocephalic and atraumatic. Neck:  Supple; no masses or thyromegaly. Lungs:  Clear throughout to auscultation.    Heart:  Regular rate and rhythm. Abdomen:  Soft, nontender and nondistended. Normal bowel  sounds, without guarding, and without rebound.   Neurologic:  Alert and  oriented x4;  grossly normal neurologically.  Impression/Plan: Tane Biegler is here for an endoscopy and colonoscopy to be performed for chronic GERD and colon cancer screening  Risks, benefits, limitations, and alternatives regarding  endoscopy and colonoscopy have been reviewed with the patient.  Questions have been answered.  All parties agreeable.   Sherri Sear, MD  12/12/2020, 9:14 AM

## 2020-12-12 NOTE — Op Note (Signed)
[facility] Gastroenterology Patient Name: Tammy Melton Procedure Date: 12/12/2020 9:21 AM MRN: [id number] Account #: [id number] Date of Birth: 11-Nov-1970 Admit Type: Outpatient Age: 50 Room: [facility] ENDO ROOM 3 Gender: Female Note Status: Finalized Instrument Name: Upper Endoscope [id number] Procedure:             Upper GI endoscopy Indications:           Epigastric abdominal pain, Heartburn Providers:             Toney Reil MD, MD Referring MD:          Priscille Heidelberg. Pickard (Referring MD) Medicines:             General Anesthesia Complications:         No immediate complications. Estimated blood loss: None. Procedure:             Pre-Anesthesia Assessment:                        - Prior to the procedure, a History and Physical was                         performed, and patient medications and allergies were                         reviewed. The patient is competent. The risks and                         benefits of the procedure and the sedation options and                         risks were discussed with the patient. All questions                         were answered and informed consent was obtained.                         Patient identification and proposed procedure were                         verified by the physician, the nurse, the                         anesthesiologist, the anesthetist and the technician                         in the pre-procedure area in the procedure room in the                         endoscopy suite. Mental Status Examination: alert and                         oriented. Airway Examination: normal oropharyngeal                         airway and neck mobility. Respiratory Examination:                         clear to auscultation. CV Examination: normal.  Prophylactic Antibiotics: The patient does not require                         prophylactic antibiotics. Prior Anticoagulants: The                          patient has taken no previous anticoagulant or                         antiplatelet agents. ASA Grade Assessment: II - A                         patient with mild systemic disease. After reviewing                         the risks and benefits, the patient was deemed in                         satisfactory condition to undergo the procedure. The                         anesthesia plan was to use general anesthesia.                         Immediately prior to administration of medications,                         the patient was re-assessed for adequacy to receive                         sedatives. The heart rate, respiratory rate, oxygen                         saturations, blood pressure, adequacy of pulmonary                         ventilation, and response to care were monitored                         throughout the procedure. The physical status of the                         patient was re-assessed after the procedure.                        After obtaining informed consent, the endoscope was                         passed under direct vision. Throughout the procedure,                         the patient's blood pressure, pulse, and oxygen                         saturations were monitored continuously. The Endoscope                         was introduced through the mouth, and advanced to the  second part of duodenum. The upper GI endoscopy was                         accomplished without difficulty. The patient tolerated                         the procedure well. Findings:      The duodenal bulb and second portion of the duodenum were normal.      Diffuse moderately erythematous mucosa without bleeding was found in the       gastric antrum. Biopsies were taken with a cold forceps for Helicobacter       pylori testing.      The gastric body and incisura were normal. Biopsies were taken with a       cold forceps for Helicobacter pylori testing.      The  cardia and gastric fundus were normal on retroflexion.      The gastroesophageal junction and examined esophagus were normal. Impression:            - Normal duodenal bulb and second portion of the                         duodenum.                        - Erythematous mucosa in the antrum. Biopsied.                        - Normal gastric body and incisura. Biopsied.                        - Normal gastroesophageal junction and esophagus. Recommendation:        - Await pathology results.                        - Continue present medications.                        - Proceed with colonoscopy as scheduled                        See colonoscopy report Procedure Code(s):     --- Professional ---                        [phone], Esophagogastroduodenoscopy, flexible,                         transoral; with biopsy, single or multiple Diagnosis Code(s):     --- Professional ---                        K31.89, Other diseases of stomach and duodenum                        R10.13, Epigastric pain                        R12, Heartburn CPT copyright 2019 American Medical Association. All rights reserved. The codes documented in this report are preliminary and upon coder review may  be revised to meet current compliance requirements. Dr. Libby Maw Caylon Saine Alger Memos  MD, MD 12/12/2020 9:35:58 AM This report has been signed electronically. Number of Addenda: 0 Note Initiated On: 12/12/2020 9:21 AM Estimated Blood Loss:  Estimated blood loss: none.      [facility]

## 2020-12-12 NOTE — Anesthesia Preprocedure Evaluation (Signed)
Anesthesia Evaluation  Patient identified by MRN, date of birth, ID band Patient awake    Reviewed: Allergy & Precautions, H&P , NPO status , Patient's Chart, lab work & pertinent test results  History of Anesthesia Complications Negative for: history of anesthetic complications  Airway Mallampati: II  TM Distance: >3 FB Neck ROM: full    Dental no notable dental hx.    Pulmonary neg pulmonary ROS, neg sleep apnea, neg COPD,    Pulmonary exam normal        Cardiovascular hypertension, (-) angina(-) Past MI and (-) Cardiac Stents Normal cardiovascular exam(-) dysrhythmias      Neuro/Psych  Headaches, PSYCHIATRIC DISORDERS Depression    GI/Hepatic Neg liver ROS, GERD  ,  Endo/Other  negative endocrine ROS  Renal/GU negative Renal ROS  negative genitourinary   Musculoskeletal   Abdominal   Peds  Hematology negative hematology ROS (+)   Anesthesia Other Findings Past Medical History: No date: Anemia No date: Depression No date: GERD (gastroesophageal reflux disease) No date: High blood pressure     Comment:  h/o No date: Migraines  Past Surgical History: No date: CESAREAN SECTION No date: TUBAL LIGATION  BMI    Body Mass Index: 24.03 kg/m      Reproductive/Obstetrics negative OB ROS                             Anesthesia Physical Anesthesia Plan  ASA: 2  Anesthesia Plan: General   Post-op Pain Management:    Induction:   PONV Risk Score and Plan: Propofol infusion and TIVA  Airway Management Planned:   Additional Equipment:   Intra-op Plan:   Post-operative Plan:   Informed Consent: I have reviewed the patients History and Physical, chart, labs and discussed the procedure including the risks, benefits and alternatives for the proposed anesthesia with the patient or authorized representative who has indicated his/her understanding and acceptance.     Dental  Advisory Given  Plan Discussed with: Anesthesiologist, CRNA and Surgeon  Anesthesia Plan Comments:         Anesthesia Quick Evaluation

## 2020-12-12 NOTE — Anesthesia Procedure Notes (Signed)
Date/Time: 12/12/2020 9:22 AM Performed by: Tonia Ghent Pre-anesthesia Checklist: Patient identified, Emergency Drugs available, Suction available, Patient being monitored and Timeout performed Patient Re-evaluated:Patient Re-evaluated prior to induction Oxygen Delivery Method: Nasal cannula Preoxygenation: Pre-oxygenation with 100% oxygen Induction Type: IV induction Airway Equipment and Method: Bite block Placement Confirmation: positive ETCO2 and CO2 detector

## 2020-12-12 NOTE — Anesthesia Postprocedure Evaluation (Signed)
Anesthesia Post Note  Patient: Tammy Melton  Procedure(s) Performed: ESOPHAGOGASTRODUODENOSCOPY (EGD) WITH PROPOFOL COLONOSCOPY  Patient location during evaluation: PACU Anesthesia Type: General Level of consciousness: awake and alert Pain management: pain level controlled Vital Signs Assessment: post-procedure vital signs reviewed and stable Respiratory status: spontaneous breathing, nonlabored ventilation, respiratory function stable and patient connected to nasal cannula oxygen Cardiovascular status: blood pressure returned to baseline and stable Postop Assessment: no apparent nausea or vomiting Anesthetic complications: no   No notable events documented.   Last Vitals:  Vitals:   12/12/20 1005 12/12/20 1015  BP: 104/72 100/68  Pulse: 77 91  Resp: 16 18  Temp:    SpO2: 100% 100%    Last Pain:  Vitals:   12/12/20 1015  TempSrc:   PainSc: 0-No pain                 Aurelio Brash Marycarmen Hagey

## 2020-12-12 NOTE — Transfer of Care (Signed)
Immediate Anesthesia Transfer of Care Note  Patient: Tammy Melton  Procedure(s) Performed: ESOPHAGOGASTRODUODENOSCOPY (EGD) WITH PROPOFOL COLONOSCOPY  Patient Location: PACU  Anesthesia Type:General  Level of Consciousness: awake and sedated  Airway & Oxygen Therapy: Patient Spontanous Breathing and Patient connected to nasal cannula oxygen  Post-op Assessment: Report given to RN and Post -op Vital signs reviewed and stable  Post vital signs: Reviewed and stable  Last Vitals:  Vitals Value Taken Time  BP    Temp    Pulse    Resp    SpO2      Last Pain:  Vitals:   12/12/20 0843  TempSrc: Temporal  PainSc: 0-No pain         Complications: No notable events documented.

## 2020-12-13 ENCOUNTER — Encounter: Payer: Self-pay | Admitting: Gastroenterology

## 2020-12-13 LAB — SURGICAL PATHOLOGY

## 2020-12-15 ENCOUNTER — Other Ambulatory Visit: Payer: Self-pay | Admitting: Family Medicine

## 2021-02-03 ENCOUNTER — Encounter: Payer: Self-pay | Admitting: Gastroenterology

## 2021-02-03 ENCOUNTER — Ambulatory Visit (INDEPENDENT_AMBULATORY_CARE_PROVIDER_SITE_OTHER): Admitting: Gastroenterology

## 2021-02-03 ENCOUNTER — Other Ambulatory Visit: Payer: Self-pay

## 2021-02-03 VITALS — BP 144/83 | HR 105 | Temp 99.1°F | Ht 64.0 in | Wt 144.0 lb

## 2021-02-03 DIAGNOSIS — Z862 Personal history of diseases of the blood and blood-forming organs and certain disorders involving the immune mechanism: Secondary | ICD-10-CM | POA: Diagnosis not present

## 2021-02-03 DIAGNOSIS — K219 Gastro-esophageal reflux disease without esophagitis: Secondary | ICD-10-CM

## 2021-02-03 DIAGNOSIS — R1013 Epigastric pain: Secondary | ICD-10-CM | POA: Insufficient documentation

## 2021-02-03 MED ORDER — OMEPRAZOLE 40 MG PO CPDR: 40.0000 mg | DELAYED_RELEASE_CAPSULE | Freq: Every day | ORAL | 2 refills | Status: DC

## 2021-02-03 NOTE — Patient Instructions (Addendum)
Your RUQ is schedule for 02/11/2021 arrive at 7:45am at out patient imaging.  nothing to eat or drink after midnight.  Address is [address], [place], [place] [address]. Phone number is [phone]

## 2021-02-03 NOTE — Progress Notes (Signed)
Arlyss Repress, MD [address]  [address]  [place], [place] [address]  Main: [phone]  Fax: [phone]    Gastroenterology Consultation  Referring Provider:     Donita Brooks, MD Primary Care Physician:  Donita Brooks, MD Primary Gastroenterologist:  Dr. Arlyss Repress Reason for Consultation:   Heartburn        HPI:   Tammy Melton is a 51 y.o. female referred by Dr. Donita Brooks, MD  for consultation & management of heartburn.  Patient reports that she has been experiencing burning in her chest radiating to both sides of her chest associated with some epigastric discomfort.  This has started in May.  She was taking over-the-counter Nexium which helped with her symptoms.  Her heartburn would return when she stops taking medication for 2 days.  After she has seen her PCP, she was started on Protonix 40 mg twice daily and sucralfate 1 g 4 times a day.  She also reports difficulty swallowing when she is not on PPI.  Patient denies any other symptoms.  She does have regular bowel movements.  She denies nausea or vomiting.  She does report some regurgitation.  She used to have nocturnal symptoms which resolved since taking PPI.  Patient gained about 10 pounds within last 1 year  Patient does not smoke or drink alcohol She works in a childcare full day  Follow-up visit 02/03/2021 Patient is here for follow-up of epigastric discomfort as well as burning in her chest.  She has ongoing symptoms.  She continues to take Protonix 40 mg p.o. twice daily.  Patient also reports that despite fasting, she has ongoing symptoms.  Her weight has been stable.  And she tries to follow antireflux lifestyle.  She does have migraine headaches and she takes Excedrin as needed. She does have history of iron deficiency without anemia.  NSAIDs: Excedrin  Antiplts/Anticoagulants/Anti thrombotics: None  GI Procedures:  EGD and colonoscopy 12/12/2020  - The examined portion of the ileum  was normal. - The entire examined colon is normal. - The distal rectum and anal verge are normal on retroflexion view. - No specimens collected.  - Normal duodenal bulb and second portion of the duodenum. - Erythematous mucosa in the antrum. Biopsied. - Normal gastric body and incisura. Biopsied. - Normal gastroesophageal junction and esophagus.  DIAGNOSIS:  A. STOMACH; COLD BIOPSY:  - ANTRAL AND OXYNTIC MUCOSA WITH MILD PATCHY CHRONIC GASTRITIS.  - NEGATIVE FOR H. PYLORI, INTESTINAL METAPLASIA, DYSPLASIA, AND  MALIGNANCY.  She denies family history of GI malignancy  Past Medical History:  Diagnosis Date   Anemia    Depression    GERD (gastroesophageal reflux disease)    High blood pressure    h/o   Migraines     Past Surgical History:  Procedure Laterality Date   CESAREAN SECTION     COLONOSCOPY N/A 12/12/2020   Procedure: COLONOSCOPY;  Surgeon: Toney Reil, MD;  Location: [facility] ENDOSCOPY;  Service: Gastroenterology;  Laterality: N/A;   ESOPHAGOGASTRODUODENOSCOPY (EGD) WITH PROPOFOL N/A 12/12/2020   Procedure: ESOPHAGOGASTRODUODENOSCOPY (EGD) WITH PROPOFOL;  Surgeon: Toney Reil, MD;  Location: [facility] ENDOSCOPY;  Service: Gastroenterology;  Laterality: N/A;   TUBAL LIGATION      Current Outpatient Medications:    levonorgestrel (MIRENA, 52 MG,) 20 MCG/DAY IUD, Mirena 20 mcg/24 hours (8 yrs) 52 mg intrauterine device  Take 1 device every day by intrauterine route., Disp: , Rfl:    omeprazole (PRILOSEC) 40 MG  capsule, Take 1 capsule (40 mg total) by mouth daily before breakfast., Disp: 30 capsule, Rfl: 2   oxybutynin (DITROPAN-XL) 10 MG 24 hr tablet, TAKE 1 TABLET(10 MG) BY MOUTH AT BEDTIME, Disp: 30 tablet, Rfl: 3    Family History  Problem Relation Age of Onset   Breast cancer Cousin      Social History   Tobacco Use   Smoking status: Never   Smokeless tobacco: Never  Vaping Use   Vaping Use: Never used  Substance Use Topics   Alcohol use: No    Drug use: No    Allergies as of 02/03/2021   (No Known Allergies)    Review of Systems:    All systems reviewed and negative except where noted in HPI.   Physical Exam:  BP (!) 144/83 (BP Location: Left Arm, Patient Position: Sitting, Cuff Size: Normal)    Pulse (!) 105    Temp 99.1 F (37.3 C) (Oral)    Ht 5\' 4"  (1.626 m)    Wt 144 lb (65.3 kg)    BMI 24.72 kg/m  No LMP recorded. (Menstrual status: IUD).  General:   Alert,  Well-developed, well-nourished, pleasant and cooperative in NAD Head:  Normocephalic and atraumatic. Eyes:  Sclera clear, no icterus.   Conjunctiva pink. Ears:  Normal auditory acuity. Nose:  No deformity, discharge, or lesions. Mouth:  No deformity or lesions,oropharynx pink & moist. Neck:  Supple; no masses or thyromegaly. Lungs:  Respirations even and unlabored.  Clear throughout to auscultation.   No wheezes, crackles, or rhonchi. No acute distress. Heart:  Regular rate and rhythm; no murmurs, clicks, rubs, or gallops. Abdomen:  Normal bowel sounds. Soft, non-tender and mildly distended, tympanic without masses, hepatosplenomegaly or hernias noted.  No guarding or rebound tenderness.   Rectal: Not performed Msk:  Symmetrical without gross deformities. Good, equal movement & strength bilaterally. Pulses:  Normal pulses noted. Extremities:  No clubbing or edema.  No cyanosis. Neurologic:  Alert and oriented x3;  grossly normal neurologically. Skin:  Intact without significant lesions or rashes. No jaundice. Psych:  Alert and cooperative. Normal mood and affect.  Imaging Studies: Reviewed  Assessment and Plan:   [name] is a 51 y.o. pleasant African-American female with no significant past medical history seen in consultation for approximately 6 months history of heartburn, regurgitation, currently on PPI, unable to wean off PPI  Chronic heartburn and epigastric discomfort EGD was unremarkable, no evidence of H. pylori Switch from high-dose  Protonix to Prilosec 40 mg daily before breakfast Continue antireflux lifestyle, information provided Discussed about food intolerances, patient agreed to have alpha gal panel and food allergy profile Recommend right upper quadrant ultrasound, if no cholelithiasis, consider HIDA scan  Iron deficiency history of iron deficiency anemia Check iron panel, B12 and folate levels   Follow up in 3 to 4 months   52, MD

## 2021-02-03 NOTE — Progress Notes (Signed)
ge

## 2021-02-04 LAB — B12 AND FOLATE PANEL
Folate: 12.4 ng/mL (ref >3.0)
Vitamin B-12: 513 pg/mL (ref 232–1245)

## 2021-02-07 LAB — ALPHA-GAL PANEL
Allergen Lamb IgE: <0.1 kU/L
Beef IgE: <0.1 kU/L
IgE (Immunoglobulin E), Serum: 18 IU/mL (ref 6–495)
O215-IgE Alpha-Gal: <0.1 kU/L
Pork IgE: <0.1 kU/L

## 2021-02-07 LAB — FERRITIN: Ferritin: 27 ng/mL (ref 15–150)

## 2021-02-07 LAB — FOOD ALLERGY PROFILE
Allergen Corn, IgE: <0.1 kU/L
Clam IgE: <0.1 kU/L
Codfish IgE: <0.1 kU/L
Egg White IgE: <0.1 kU/L
Milk IgE: 0.21 kU/L — AB
Peanut IgE: <0.1 kU/L
Scallop IgE: <0.1 kU/L
Sesame Seed IgE: <0.1 kU/L
Shrimp IgE: <0.1 kU/L
Soybean IgE: <0.1 kU/L
Walnut IgE: <0.1 kU/L
Wheat IgE: <0.1 kU/L

## 2021-02-10 ENCOUNTER — Telehealth: Payer: Self-pay

## 2021-02-10 DIAGNOSIS — K219 Gastro-esophageal reflux disease without esophagitis: Secondary | ICD-10-CM

## 2021-02-10 MED ORDER — OMEPRAZOLE 40 MG PO CPDR: 40.0000 mg | DELAYED_RELEASE_CAPSULE | Freq: Every day | ORAL | 2 refills | Status: DC

## 2021-02-10 NOTE — Telephone Encounter (Signed)
Patient called to state that the [facility] did not receive the omeprazole that was called in at her appointment. Resent medication to the pharmacy

## 2021-02-11 ENCOUNTER — Ambulatory Visit
Admission: RE | Admit: 2021-02-11 | Discharge: 2021-02-11 | Disposition: A | Discharge status: Home / Self Care | Source: Ambulatory Visit | Attending: Gastroenterology | Admitting: Gastroenterology

## 2021-02-11 ENCOUNTER — Other Ambulatory Visit: Payer: Self-pay

## 2021-02-11 DIAGNOSIS — R1013 Epigastric pain: Secondary | ICD-10-CM | POA: Diagnosis present

## 2021-02-17 ENCOUNTER — Ambulatory Visit
Admission: RE | Admit: 2021-02-17 | Discharge: 2021-02-17 | Disposition: A | Discharge status: Home / Self Care | Source: Ambulatory Visit | Attending: Obstetrics and Gynecology | Admitting: Obstetrics and Gynecology

## 2021-02-17 DIAGNOSIS — R921 Mammographic calcification found on diagnostic imaging of breast: Secondary | ICD-10-CM

## 2021-02-24 ENCOUNTER — Other Ambulatory Visit: Payer: Self-pay | Admitting: Family Medicine

## 2021-05-05 ENCOUNTER — Encounter: Payer: Self-pay | Admitting: Gastroenterology

## 2021-05-05 ENCOUNTER — Ambulatory Visit (INDEPENDENT_AMBULATORY_CARE_PROVIDER_SITE_OTHER): Admitting: Gastroenterology

## 2021-05-05 VITALS — BP 147/82 | HR 116 | Temp 98.4°F | Ht 64.0 in | Wt 146.1 lb

## 2021-05-05 DIAGNOSIS — R790 Abnormal level of blood mineral: Secondary | ICD-10-CM

## 2021-05-05 DIAGNOSIS — R1013 Epigastric pain: Secondary | ICD-10-CM

## 2021-05-05 NOTE — Patient Instructions (Signed)
Your [facility] scan is schedule for 05/16/2021 at 9:00am. Please arrive to the medical mall at 8:30am. Nothing to eat or drink 6 hours before the scan  ?

## 2021-05-05 NOTE — Progress Notes (Signed)
?  ?Arlyss Repress, MD ?[address]  ?[address]  ?[place], [place] [address]  ?Main: [phone]  ?Fax: [phone] ? ? ? ?Gastroenterology Consultation ? ?Referring Provider:     Donita Brooks, MD ?Primary Care Physician:  Donita Brooks, MD ?Primary Gastroenterologist:  Dr. Arlyss Repress ?Reason for Consultation: Substernal burning and abdominal bloating ?      ? HPI:   ?Tammy Melton is a 51 y.o. female referred by Dr. Donita Brooks, MD  for consultation & management of heartburn.  Patient reports that she has been experiencing burning in her chest radiating to both sides of her chest associated with some epigastric discomfort.  This has started in May.  She was taking over-the-counter Nexium which helped with her symptoms.  Her heartburn would return when she stops taking medication for 2 days.  After she has seen her PCP, she was started on Protonix 40 mg twice daily and sucralfate 1 g 4 times a day.  She also reports difficulty swallowing when she is not on PPI.  Patient denies any other symptoms.  She does have regular bowel movements.  She denies nausea or vomiting.  She does report some regurgitation.  She used to have nocturnal symptoms which resolved since taking PPI.  Patient gained about 10 pounds within last 1 year ? ?Patient does not smoke or drink alcohol ?She works in a childcare full day ? ?Follow-up visit 02/03/2021 ?Patient is here for follow-up of epigastric discomfort as well as burning in her chest.  She has ongoing symptoms.  She continues to take Protonix 40 mg p.o. twice daily.  Patient also reports that despite fasting, she has ongoing symptoms.  Her weight has been stable.  And she tries to follow antireflux lifestyle.  She does have migraine headaches and she takes Excedrin as needed. ?She does have history of iron deficiency without anemia. ? ?Follow-up visit 05/05/2021 ?Patient is here for follow-up of substernal burning as well as abdominal bloating.  Patient states  that she was feeling fine for quite some time and then her symptoms recurred after she stopped taking Prilosec.  She restarted omeprazole 40 mg 1-2 times daily.  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  However, patient is not strictly avoiding lactose, she likes eating cheese although not daily.  Her weight has been stable.  She underwent right upper quadrant ultrasound which revealed gallbladder sludge versus polyp ? ?NSAIDs: Excedrin ? ?Antiplts/Anticoagulants/Anti thrombotics: None ? ?GI Procedures:  ?EGD and colonoscopy 12/12/2020  ?- The examined portion of the ileum was normal. ?- The entire examined colon is normal. ?- The distal rectum and anal verge are normal on retroflexion view. ?- No specimens collected. ? ?- Normal duodenal bulb and second portion of the duodenum. ?- Erythematous mucosa in the antrum. Biopsied. ?- Normal gastric body and incisura. Biopsied. ?- Normal gastroesophageal junction and esophagus. ? ?DIAGNOSIS:  ?A. STOMACH; COLD BIOPSY:  ?- ANTRAL AND OXYNTIC MUCOSA WITH MILD PATCHY CHRONIC GASTRITIS.  ?- NEGATIVE FOR H. PYLORI, INTESTINAL METAPLASIA, DYSPLASIA, AND  ?MALIGNANCY.  ?She denies family history of GI malignancy ? ?Past Medical History:  ?Diagnosis Date  ? Anemia   ? Depression   ? Gastric erythema   ? GERD (gastroesophageal reflux disease)   ? High blood pressure   ? h/o  ? Migraines   ? ? ?Past Surgical History:  ?Procedure Laterality Date  ? CESAREAN SECTION    ?  COLONOSCOPY N/A 12/12/2020  ? Procedure: COLONOSCOPY;  Surgeon: Toney ReilVanga, Amry Cathy Reddy, MD;  [facility];  Service: Gastroenterology;  Laterality: N/A;  ? ESOPHAGOGASTRODUODENOSCOPY (EGD) WITH PROPOFOL N/A 12/12/2020  ? Procedure: ESOPHAGOGASTRODUODENOSCOPY (EGD) WITH PROPOFOL;  Surgeon: Toney ReilVanga, Dock Baccam Reddy, MD;  [facility];  Service: Gastroenterology;  Laterality: N/A;  ? TUBAL LIGATION    ? ? ?Current Outpatient Medications:  ?  levonorgestrel  (MIRENA, 52 MG,) 20 MCG/DAY IUD, Mirena 20 mcg/24 hours (8 yrs) 52 mg intrauterine device  Take 1 device every day by intrauterine route., Disp: , Rfl:  ?  omeprazole (PRILOSEC) 40 MG capsule, Take 1 capsule (40 mg total) by mouth daily before breakfast., Disp: 30 capsule, Rfl: 2 ?  oxybutynin (DITROPAN-XL) 10 MG 24 hr tablet, TAKE 1 TABLET(10 MG) BY MOUTH AT BEDTIME, Disp: 30 tablet, Rfl: 3 ? ? ? ?Family History  ?Problem Relation Age of Onset  ? Breast cancer Cousin   ?  ? ?Social History  ? ?Tobacco Use  ? Smoking status: Never  ? Smokeless tobacco: Never  ?Vaping Use  ? Vaping Use: Never used  ?Substance Use Topics  ? Alcohol use: No  ? Drug use: No  ? ? ?Allergies as of 05/05/2021  ? (No Known Allergies)  ? ? ?Review of Systems:    ?All systems reviewed and negative except where noted in HPI. ? ? Physical Exam:  ?BP (!) 147/82 (BP Location: Left Arm, Patient Position: Sitting, Cuff Size: Normal)   Pulse (!) 116   Temp 98.4 ?F (36.9 ?C) (Oral)   Ht 5\' 4"  (1.626 m)   Wt 146 lb 2 oz (66.3 kg)   BMI 25.08 kg/m?  ?No LMP recorded. (Menstrual status: IUD). ? ?General:   Alert,  Well-developed, well-nourished, pleasant and cooperative in NAD ?Head:  Normocephalic and atraumatic. ?Eyes:  Sclera clear, no icterus.   Conjunctiva pink. ?Ears:  Normal auditory acuity. ?Nose:  No deformity, discharge, or lesions. ?Mouth:  No deformity or lesions,oropharynx pink & moist. ?Neck:  Supple; no masses or thyromegaly. ?Lungs:  Respirations even and unlabored.  Clear throughout to auscultation.   No wheezes, crackles, or rhonchi. No acute distress. ?Heart:  Regular rate and rhythm; no murmurs, clicks, rubs, or gallops. ?Abdomen:  Normal bowel sounds. Soft, non-tender and mildly distended, tympanic without masses, hepatosplenomegaly or hernias noted.  No guarding or rebound tenderness.   ?Rectal: Not performed ?Msk:  Symmetrical without gross deformities. Good, equal movement & strength bilaterally. ?Pulses:  Normal pulses  noted. ?Extremities:  No clubbing or edema.  No cyanosis. ?Neurologic:  Alert and oriented x3;  grossly normal neurologically. ?Skin:  Intact without significant lesions or rashes. No jaundice. ?Psych:  Alert and cooperative. Normal mood and affect. ? ?Imaging Studies: ?Reviewed ? ?Assessment and Plan:  ? ?Raiford NobleLakiasha Finamore is a 51 y.o. pleasant African-American female with no significant past medical history seen in consultation for approximately 6 months history of heartburn, regurgitation, currently on PPI, unable to wean off PPI ? ?Chronic heartburn and epigastric discomfort ?EGD was unremarkable, no evidence of H. pylori ?Continue Prilosec 40 mg daily before breakfast ?Continue antireflux lifestyle, information provided ?Patient has lactose intolerance/allergy, reiterated about strict lactose-free diet as well as trial of lactobacillus probiotics, Restora samples provided today ?Right upper quadrant ultrasound revealed sludge versus gallbladder polyp, recommend HIDA scan ?Check H. pylori IgG and treat empirically for H. pylori infection if positive ? ?History of iron deficiency anemia ?Resolved with iron supplements, serum ferritin levels are still low, recheck  levels today ? ? ?Follow up in 6 months ? ? ?Arlyss Repress, MD ? ?

## 2021-05-06 LAB — H. PYLORI ANTIBODY, IGG: H. pylori, IgG AbS: 0.13 Index Value (ref 0.00–0.79)

## 2021-05-06 LAB — FERRITIN: Ferritin: 23 ng/mL (ref 15–150)

## 2021-05-16 ENCOUNTER — Other Ambulatory Visit

## 2021-05-21 ENCOUNTER — Other Ambulatory Visit: Payer: Self-pay | Admitting: Gastroenterology

## 2021-05-21 DIAGNOSIS — K219 Gastro-esophageal reflux disease without esophagitis: Secondary | ICD-10-CM

## 2021-05-27 ENCOUNTER — Ambulatory Visit
Admission: RE | Admit: 2021-05-27 | Discharge: 2021-05-27 | Disposition: A | Discharge status: Home / Self Care | Source: Ambulatory Visit | Attending: Gastroenterology | Admitting: Gastroenterology

## 2021-05-27 DIAGNOSIS — R1013 Epigastric pain: Secondary | ICD-10-CM | POA: Insufficient documentation

## 2021-05-27 MED ORDER — TECHNETIUM TC 99M MEBROFENIN IV KIT
4.9300 | PACK | Freq: Once | INTRAVENOUS | Status: AC | PRN
  Administered 2021-05-27: 4.93 via INTRAVENOUS

## 2021-07-02 ENCOUNTER — Other Ambulatory Visit: Payer: Self-pay | Admitting: Family Medicine

## 2021-07-02 ENCOUNTER — Encounter: Payer: Self-pay | Admitting: Gastroenterology

## 2021-07-02 DIAGNOSIS — K219 Gastro-esophageal reflux disease without esophagitis: Secondary | ICD-10-CM

## 2021-11-03 ENCOUNTER — Other Ambulatory Visit: Payer: Self-pay

## 2021-11-05 ENCOUNTER — Ambulatory Visit: Admitting: Gastroenterology

## 2021-11-09 ENCOUNTER — Other Ambulatory Visit: Payer: Self-pay | Admitting: Family Medicine

## 2021-11-10 NOTE — Telephone Encounter (Signed)
Requested medications are due for refill today.  yes  Requested medications are on the active medications list.  yes  Last refill. 07/02/2021 #30 3 rf  Future visit scheduled.   no  Notes to clinic.  Pt last seen 09/17/2020    Requested Prescriptions  Pending Prescriptions Disp Refills   oxybutynin (DITROPAN-XL) 10 MG 24 hr tablet [Pharmacy Med Name: OXYBUTYNIN ER 10MG  TABLETS] 30 tablet 3    Sig: TAKE 1 TABLET(10 MG) BY MOUTH AT BEDTIME     Urology:  Bladder Agents Failed - 11/09/2021 10:30 AM      Failed - Valid encounter within last 12 months    Recent Outpatient Visits           1 year ago General medical exam   [place] [name], MD   1 year ago Gastroesophageal reflux disease, unspecified whether esophagitis present   [place] [name], MD   1 year ago Cervical radiculopathy   [place] [name], MD   2 years ago Acute pain of left knee   [place] [name], MD   3 years ago Iron deficiency anemia, unspecified iron deficiency anemia type   [place], [place] Nunnery, MD       Future Appointments             In 2 months Vanga, Tally Due, MD [place]

## 2021-11-12 ENCOUNTER — Other Ambulatory Visit: Payer: Self-pay | Admitting: Family Medicine

## 2021-11-13 NOTE — Telephone Encounter (Signed)
Requested medications are due for refill today.  unsure  Requested medications are on the active medications list.  yes  Last refill. 07/02/2021 #30 3 rf  Future visit scheduled.   no  Notes to clinic.  Recent request was refused. The reason was " already responded to". I do not see that this medication was refilled recently. Pt last seen 09/17/2020.    Requested Prescriptions  Pending Prescriptions Disp Refills   oxybutynin (DITROPAN-XL) 10 MG 24 hr tablet [Pharmacy Med Name: OXYBUTYNIN ER 10MG  TABLETS] 30 tablet 3    Sig: TAKE 1 TABLET(10 MG) BY MOUTH AT BEDTIME     Urology:  Bladder Agents Failed - 11/12/2021  7:48 PM      Failed - Valid encounter within last 12 months    Recent Outpatient Visits           1 year ago General medical exam   [facility], MD   1 year ago Gastroesophageal reflux disease, unspecified whether esophagitis present   [facility], MD   1 year ago Cervical radiculopathy   [facility], MD   2 years ago Acute pain of left knee   [facility], MD   3 years ago Iron deficiency anemia, unspecified iron deficiency anemia type   [facility] [place], [place], MD       Future Appointments             In 2 months Vanga, Velna Hatchet, MD [facility] [place]

## 2021-11-18 ENCOUNTER — Other Ambulatory Visit: Payer: Self-pay | Admitting: Family Medicine

## 2021-11-18 NOTE — Telephone Encounter (Signed)
Requested medication (s) are due for refill today: possibly  Requested medication (s) are on the active medication list: yes  Last refill:  07/02/21  Future visit scheduled: yes  Notes to clinic:  Unable to refill per protocol, last refill by provider 07/02/21 for 30 and 3 RF, request was refused 11/10/21 due to request too soon. Routing for review, medication may be due for refill.     Requested Prescriptions  Pending Prescriptions Disp Refills   oxybutynin (DITROPAN-XL) 10 MG 24 hr tablet [Pharmacy Med Name: OXYBUTYNIN ER 10MG  TABLETS] 30 tablet 3    Sig: TAKE 1 TABLET(10 MG) BY MOUTH AT BEDTIME     Urology:  Bladder Agents Failed - 11/18/2021  1:30 PM      Failed - Valid encounter within last 12 months    Recent Outpatient Visits           1 year ago General medical exam   [facility], MD   1 year ago Gastroesophageal reflux disease, unspecified whether esophagitis present   [facility], MD   1 year ago Cervical radiculopathy   [facility], MD   2 years ago Acute pain of left knee   [facility], MD   3 years ago Iron deficiency anemia, unspecified iron deficiency anemia type   [facility] [place], [place], MD       Future Appointments             In 1 month Vanga, Velna Hatchet, MD [facility] [place]

## 2022-01-12 ENCOUNTER — Encounter: Payer: Self-pay | Admitting: Family Medicine

## 2022-01-12 ENCOUNTER — Encounter: Payer: Self-pay | Admitting: Gastroenterology

## 2022-01-12 ENCOUNTER — Ambulatory Visit: Admitting: Gastroenterology

## 2022-01-12 ENCOUNTER — Ambulatory Visit (INDEPENDENT_AMBULATORY_CARE_PROVIDER_SITE_OTHER): Admitting: Family Medicine

## 2022-01-12 VITALS — BP 130/75 | HR 116 | Temp 98.1°F | Ht 64.0 in | Wt 147.1 lb

## 2022-01-12 VITALS — BP 124/70 | HR 94 | Ht 64.0 in | Wt 147.0 lb

## 2022-01-12 DIAGNOSIS — Z0001 Encounter for general adult medical examination with abnormal findings: Secondary | ICD-10-CM

## 2022-01-12 DIAGNOSIS — Z0000 Encounter for general adult medical examination without abnormal findings: Secondary | ICD-10-CM

## 2022-01-12 DIAGNOSIS — Z13220 Encounter for screening for lipoid disorders: Secondary | ICD-10-CM

## 2022-01-12 DIAGNOSIS — K219 Gastro-esophageal reflux disease without esophagitis: Secondary | ICD-10-CM

## 2022-01-12 DIAGNOSIS — R1013 Epigastric pain: Secondary | ICD-10-CM | POA: Diagnosis not present

## 2022-01-12 DIAGNOSIS — M7552 Bursitis of left shoulder: Secondary | ICD-10-CM | POA: Diagnosis not present

## 2022-01-12 MED ORDER — RIZATRIPTAN BENZOATE 10 MG PO TABS: 10.0000 mg | ORAL_TABLET | ORAL | 0 refills | Status: DC | PRN

## 2022-01-12 MED ORDER — OXYBUTYNIN CHLORIDE ER 10 MG PO TB24: ORAL_TABLET | ORAL | 3 refills | Status: DC

## 2022-01-12 NOTE — Progress Notes (Signed)
Subjective:    Patient ID: Tammy Melton, female    DOB: 02/25/1970, 52 y.o.   MRN: [id number]  HPI Patient is here today for complete physical exam.   Mammogram 2/23 Pap (nml)-06/2018 Colonoscopy/EGD- both 12/22 normal. Patient is getting her Pap smear through her gynecologist.  She is already had her flu shot.  She is due for a COVID booster.  She also complains of pain in her left shoulder.  It hurts to abduct her arm greater than 90 degrees.  It aches and throbs at night keeping her awake.  She has tried conservative measures such as NSAIDs without relief.  Has been going on for several weeks.  She has a positive empty can sign today and pain with abduction greater than 90 degrees without significant crepitus Past Medical History:  Diagnosis Date   Anemia    Depression    Gastric erythema    GERD (gastroesophageal reflux disease)    High blood pressure    h/o   Migraines    Past Surgical History:  Procedure Laterality Date   CESAREAN SECTION     COLONOSCOPY N/A 12/12/2020   Procedure: COLONOSCOPY;  Surgeon: Toney Reil, MD;  Location: [facility] ENDOSCOPY;  Service: Gastroenterology;  Laterality: N/A;   ESOPHAGOGASTRODUODENOSCOPY (EGD) WITH PROPOFOL N/A 12/12/2020   Procedure: ESOPHAGOGASTRODUODENOSCOPY (EGD) WITH PROPOFOL;  Surgeon: Toney Reil, MD;  Location: [facility] ENDOSCOPY;  Service: Gastroenterology;  Laterality: N/A;   TUBAL LIGATION     Current Outpatient Medications on File Prior to Visit  Medication Sig Dispense Refill   levonorgestrel (MIRENA, 52 MG,) 20 MCG/DAY IUD Mirena 20 mcg/24 hours (8 yrs) 52 mg intrauterine device  Take 1 device every day by intrauterine route.     omeprazole (PRILOSEC) 40 MG capsule TAKE 1 CAPSULE(40 MG) BY MOUTH DAILY BEFORE BREAKFAST 30 capsule 5   oxybutynin (DITROPAN-XL) 10 MG 24 hr tablet TAKE 1 TABLET(10 MG) BY MOUTH AT BEDTIME 30 tablet 3   pantoprazole (PROTONIX) 40 MG tablet      No current facility-administered  medications on file prior to visit.   No Known Allergies Social History   Socioeconomic History   Marital status: Married    Spouse name: Not on file   Number of children: Not on file   Years of education: Not on file   Highest education level: Not on file  Occupational History   Not on file  Tobacco Use   Smoking status: Never   Smokeless tobacco: Never  Vaping Use   Vaping Use: Never used  Substance and Sexual Activity   Alcohol use: No   Drug use: No   Sexual activity: Not on file  Other Topics Concern   Not on file  Social History Narrative   Not on file   Social Determinants of Health   Financial Resource Strain: Not on file  Food Insecurity: Not on file  Transportation Needs: Not on file  Physical Activity: Not on file  Stress: Not on file  Social Connections: Not on file  Intimate Partner Violence: Not on file     Review of Systems  All other systems reviewed and are negative.      Objective:   Physical Exam Vitals reviewed.  Constitutional:      General: She is not in acute distress.    Appearance: She is well-developed. She is not diaphoretic.  HENT:     Head: Normocephalic.     Right Ear: Tympanic membrane, ear canal and external ear normal.  Left Ear: Tympanic membrane, ear canal and external ear normal.     Nose: Nose normal. No congestion or rhinorrhea.     Mouth/Throat:     Mouth: Mucous membranes are moist.     Pharynx: Oropharynx is clear. No oropharyngeal exudate.  Eyes:     General: No scleral icterus.    Conjunctiva/sclera: Conjunctivae normal.  Neck:     Thyroid: Thyromegaly present.     Vascular: No JVD.  Cardiovascular:     Rate and Rhythm: Normal rate and regular rhythm.     Heart sounds: Normal heart sounds. No murmur heard.    No friction rub. No gallop.  Pulmonary:     Effort: Pulmonary effort is normal. No respiratory distress.     Breath sounds: Normal breath sounds. No stridor. No wheezing, rhonchi or rales.   Chest:     Chest wall: No tenderness.  Abdominal:     General: Abdomen is flat. Bowel sounds are normal. There is no distension.     Palpations: Abdomen is soft. There is no mass.     Tenderness: There is no abdominal tenderness. There is no guarding or rebound.  Musculoskeletal:     Cervical back: Neck supple.  Lymphadenopathy:     Cervical: No cervical adenopathy.  Skin:    General: Skin is warm.     Coloration: Skin is not pale.     Findings: No erythema or rash.  Neurological:     Mental Status: She is alert and oriented to person, place, and time.     Cranial Nerves: No cranial nerve deficit.     Motor: No abnormal muscle tone.     Coordination: Coordination normal.     Deep Tendon Reflexes: Reflexes are normal and symmetric. Reflexes normal.  Psychiatric:        Behavior: Behavior normal.        Thought Content: Thought content normal.        Judgment: Judgment normal.          Assessment & Plan:   General medical exam - Plan: CBC with Differential/Platelet, COMPLETE METABOLIC PANEL WITH GFR, Lipid panel  Gastroesophageal reflux disease, unspecified whether esophagitis present  Screening cholesterol level - Plan: CBC with Differential/Platelet, COMPLETE METABOLIC PANEL WITH GFR, Lipid panel  Subacromial bursitis of left shoulder joint I believe the patient has subacromial bursitis in her left shoulder.  Using sterile technique, I injected the subacromial space with 2 cc lidocaine, 2 cc of Marcaine, and 2 cc of 40 mg/mL Kenalog.  She tolerated the procedure well without complication.  Check CBC CMP and a lipid panel.  Blood pressure today is acceptable at 4/70.  Immunizations are up-to-date except for the COVID-vaccine.  Mammogram and colonoscopy are up-to-date.  Recommended a Pap smear in 2 years.

## 2022-01-12 NOTE — Progress Notes (Signed)
Arlyss Repress, MD [address]  [address]  [place], [place] [address]  Main: [phone]  Fax: [phone]    Gastroenterology Consultation  Referring Provider:     Donita Brooks, MD Primary Care Physician:  Donita Brooks, MD Primary Gastroenterologist:  Dr. Arlyss Repress Reason for Consultation: Epigastric burning        HPI:   Tammy Melton is a 52 y.o. female referred by Dr. Donita Brooks, MD  for consultation & management of heartburn.  Patient reports that she has been experiencing burning in her chest radiating to both sides of her chest associated with some epigastric discomfort.  This has started in May.  She was taking over-the-counter Nexium which helped with her symptoms.  Her heartburn would return when she stops taking medication for 2 days.  After she has seen her PCP, she was started on Protonix 40 mg twice daily and sucralfate 1 g 4 times a day.  She also reports difficulty swallowing when she is not on PPI.  Patient denies any other symptoms.  She does have regular bowel movements.  She denies nausea or vomiting.  She does report some regurgitation.  She used to have nocturnal symptoms which resolved since taking PPI.  Patient gained about 10 pounds within last 1 year  Patient does not smoke or drink alcohol She works in a childcare full-time  Follow-up visit 02/03/2021 Patient is here for follow-up of epigastric discomfort as well as burning in her chest.  She has ongoing symptoms.  She continues to take Protonix 40 mg p.o. twice daily.  Patient also reports that despite fasting, she has ongoing symptoms.  Her weight has been stable.  And she tries to follow antireflux lifestyle.  She does have migraine headaches and she takes Excedrin as needed. She does have history of iron deficiency without anemia.  Follow-up visit 05/05/2021 Patient is here for follow-up of substernal burning as well as abdominal bloating.  Patient states that she was feeling  fine for quite some time and then her symptoms recurred after she stopped taking Prilosec.  She restarted omeprazole 40 mg 1-2 times daily.  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  However, patient is not strictly avoiding lactose, she likes eating cheese although not daily.  Her weight has been stable.  She underwent right upper quadrant ultrasound which revealed gallbladder sludge versus polyp  Follow-up visit 2024 Patient is here for follow-up of epigastric burning pain.  Workup for dyspepsia has been negative.  She has been taking omeprazole intermittently.  She identified food triggers that cause epigastric burning pain, particularly tomato sauce and tries to avoid.  She stopped omeprazole currently and taking Tums as needed only.  She does not have any other concerns today  NSAIDs: Excedrin  Antiplts/Anticoagulants/Anti thrombotics: None  GI Procedures:  EGD and colonoscopy 12/12/2020  - The examined portion of the ileum was normal. - The entire examined colon is normal. - The distal rectum and anal verge are normal on retroflexion view. - No specimens collected.  - Normal duodenal bulb and second portion of the duodenum. - Erythematous mucosa in the antrum. Biopsied. - Normal gastric body and incisura. Biopsied. - Normal gastroesophageal junction and esophagus.  DIAGNOSIS:  A. STOMACH; COLD BIOPSY:  - ANTRAL AND OXYNTIC MUCOSA WITH MILD PATCHY CHRONIC GASTRITIS.  - NEGATIVE FOR H. PYLORI, INTESTINAL METAPLASIA, DYSPLASIA, AND  MALIGNANCY.  She denies family history of  GI malignancy  Past Medical History:  Diagnosis Date   Anemia    Depression    Gastric erythema    GERD (gastroesophageal reflux disease)    High blood pressure    h/o   Migraines     Past Surgical History:  Procedure Laterality Date   CESAREAN SECTION     COLONOSCOPY N/A 12/12/2020   Procedure: COLONOSCOPY;  Surgeon: Toney Reil, MD;  Location: [facility]  ENDOSCOPY;  Service: Gastroenterology;  Laterality: N/A;   ESOPHAGOGASTRODUODENOSCOPY (EGD) WITH PROPOFOL N/A 12/12/2020   Procedure: ESOPHAGOGASTRODUODENOSCOPY (EGD) WITH PROPOFOL;  Surgeon: Toney Reil, MD;  Location[facility] ENDOSCOPY;  Service: Gastroenterology;  Laterality: N/A;   TUBAL LIGATION      Current Outpatient Medications:    levonorgestrel (MIRENA, 52 MG,) 20 MCG/DAY IUD, Mirena 20 mcg/24 hours (8 yrs) 52 mg intrauterine device  Take 1 device every day by intrauterine route., Disp: , Rfl:    omeprazole (PRILOSEC) 40 MG capsule, TAKE 1 CAPSULE(40 MG) BY MOUTH DAILY BEFORE BREAKFAST, Disp: 30 capsule, Rfl: 5   oxybutynin (DITROPAN-XL) 10 MG 24 hr tablet, TAKE 1 TABLET(10 MG) BY MOUTH AT BEDTIME, Disp: 90 tablet, Rfl: 3   rizatriptan (MAXALT) 10 MG tablet, Take 1 tablet (10 mg total) by mouth as needed for migraine. May repeat in 2 hours if needed, Disp: 10 tablet, Rfl: 0    Family History  Problem Relation Age of Onset   Breast cancer Cousin      Social History   Tobacco Use   Smoking status: Never   Smokeless tobacco: Never  Vaping Use   Vaping Use: Never used  Substance Use Topics   Alcohol use: No   Drug use: No    Allergies as of 01/12/2022   (No Known Allergies)    Review of Systems:    All systems reviewed and negative except where noted in HPI.   Physical Exam:  BP 130/75 (BP Location: Left Arm, Patient Position: Sitting, Cuff Size: Normal)   Pulse (!) 116   Temp 98.1 F (36.7 C) (Oral)   Ht 5\' 4"  (1.626 m)   Wt 147 lb 2 oz (66.7 kg)   BMI 25.25 kg/m  No LMP recorded. (Menstrual status: IUD).  General:   Alert,  Well-developed, well-nourished, pleasant and cooperative in NAD Head:  Normocephalic and atraumatic. Eyes:  Sclera clear, no icterus.   Conjunctiva pink. Ears:  Normal auditory acuity. Nose:  No deformity, discharge, or lesions. Mouth:  No deformity or lesions,oropharynx pink & moist. Neck:  Supple; no masses or  thyromegaly. Lungs:  Respirations even and unlabored.  Clear throughout to auscultation.   No wheezes, crackles, or rhonchi. No acute distress. Heart:  Regular rate and rhythm; no murmurs, clicks, rubs, or gallops. Abdomen:  Normal bowel sounds. Soft, non-tender and mildly distended, tympanic without masses, hepatosplenomegaly or hernias noted.  No guarding or rebound tenderness.   Rectal: Not performed Msk:  Symmetrical without gross deformities. Good, equal movement & strength bilaterally. Pulses:  Normal pulses noted. Extremities:  No clubbing or edema.  No cyanosis. Neurologic:  Alert and oriented x3;  grossly normal neurologically. Skin:  Intact without significant lesions or rashes. No jaundice. Psych:  Alert and cooperative. Normal mood and affect.  Imaging Studies: Reviewed  Assessment and Plan:   Tammy Melton is a 51 y.o. pleasant African-American female with no significant past medical history seen in for follow-up of chronic symptoms of GERD and epigastric burning pain  Chronic heartburn and epigastric burning:  Currently intermittent and triggered by certain foods Currently off PPI EGD was unremarkable, no evidence of H. pylori H. pylori IgG is negative Continue antireflux lifestyle Patient has lactose intolerance/allergy, reiterated about strict lactose-free diet as well as trial of lactobacillus probiotics, [facility]ora samples provided today Right upper quadrant ultrasound revealed sludge versus gallbladder polyp, HIDA scan is negative Advised to take PPI or H2 blocker as needed  History of iron deficiency anemia: Currently resolved EGD and colonoscopy were unremarkable   Follow up as needed   Cephas Darby, MD

## 2022-01-13 LAB — COMPLETE METABOLIC PANEL WITH GFR
AG Ratio: 1.7 (calc) (ref 1.0–2.5)
ALT: 12 U/L (ref 6–29)
AST: 14 U/L (ref 10–35)
Albumin: 4.8 g/dL (ref 3.6–5.1)
Alkaline phosphatase (APISO): 46 U/L (ref 31–125)
BUN: 8 mg/dL (ref 7–25)
CO2: 24 mmol/L (ref 20–32)
Calcium: 9.9 mg/dL (ref 8.6–10.2)
Chloride: 107 mmol/L (ref 98–110)
Creat: 0.79 mg/dL (ref 0.50–0.99)
Globulin: 2.8 g/dL (calc) (ref 1.9–3.7)
Glucose, Bld: 98 mg/dL (ref 65–99)
Potassium: 4.2 mmol/L (ref 3.5–5.3)
Sodium: 140 mmol/L (ref 135–146)
Total Bilirubin: 0.7 mg/dL (ref 0.2–1.2)
Total Protein: 7.6 g/dL (ref 6.1–8.1)
eGFR: 92 mL/min/{1.73_m2} (ref >=60)

## 2022-01-13 LAB — LIPID PANEL
Cholesterol: 209 mg/dL — ABNORMAL HIGH (ref <200)
HDL: 43 mg/dL — ABNORMAL LOW (ref >=50)
LDL Cholesterol (Calc): 149 mg/dL (calc) — ABNORMAL HIGH
Non-HDL Cholesterol (Calc): 166 mg/dL (calc) — ABNORMAL HIGH (ref <130)
Total CHOL/HDL Ratio: 4.9 (calc) (ref <5.0)
Triglycerides: 72 mg/dL (ref <150)

## 2022-01-13 LAB — CBC WITH DIFFERENTIAL/PLATELET
Absolute Monocytes: 409 cells/uL (ref 200–950)
Basophils Absolute: 101 cells/uL (ref 0–200)
Basophils Relative: 1.8 %
Eosinophils Absolute: 50 cells/uL (ref 15–500)
Eosinophils Relative: 0.9 %
HCT: 43.7 % (ref 35.0–45.0)
Hemoglobin: 14.4 g/dL (ref 11.7–15.5)
Lymphs Abs: 1378 cells/uL (ref 850–3900)
MCH: 28.9 pg (ref 27.0–33.0)
MCHC: 33 g/dL (ref 32.0–36.0)
MCV: 87.8 fL (ref 80.0–100.0)
MPV: 11.2 fL (ref 7.5–12.5)
Monocytes Relative: 7.3 %
Neutro Abs: 3662 cells/uL (ref 1500–7800)
Neutrophils Relative %: 65.4 %
Platelets: 290 10*3/uL (ref 140–400)
RBC: 4.98 10*6/uL (ref 3.80–5.10)
RDW: 13 % (ref 11.0–15.0)
Total Lymphocyte: 24.6 %
WBC: 5.6 10*3/uL (ref 3.8–10.8)

## 2022-03-30 ENCOUNTER — Other Ambulatory Visit: Payer: Self-pay | Admitting: Obstetrics and Gynecology

## 2022-03-30 DIAGNOSIS — R921 Mammographic calcification found on diagnostic imaging of breast: Secondary | ICD-10-CM

## 2022-04-20 ENCOUNTER — Other Ambulatory Visit

## 2022-04-21 ENCOUNTER — Ambulatory Visit: Admitting: Family Medicine

## 2022-04-21 ENCOUNTER — Other Ambulatory Visit

## 2022-04-21 VITALS — BP 140/88 | HR 98 | Temp 97.6°F | Ht 64.0 in | Wt 148.0 lb

## 2022-04-21 DIAGNOSIS — E7800 Pure hypercholesterolemia, unspecified: Secondary | ICD-10-CM

## 2022-04-21 DIAGNOSIS — S46012S Strain of muscle(s) and tendon(s) of the rotator cuff of left shoulder, sequela: Secondary | ICD-10-CM

## 2022-04-21 MED ORDER — MELOXICAM 15 MG PO TABS: 15.0000 mg | ORAL_TABLET | Freq: Every day | ORAL | 2 refills | Status: DC

## 2022-04-21 NOTE — Progress Notes (Signed)
Subjective:    Patient ID: Tammy Melton, female    DOB: 1970/05/09, 52 y.o.   MRN: [id number]  HPI In January, cholesterol was found to be elevated and I recommended Crestor 10 mg poqday.  Here today for follow up.  However no one ever contacted the patient about her cholesterol and she never started the Crestor.  She admits to eating a lot of fast food.  She is also requesting a repeat cortisone injection in her left shoulder.  She continues to have pain if she abducts the shoulder greater than 90 degrees.  She has a positive drop test.  She also has pain with empty can testing.  She also has pain reaching across her arm with internal rotation.  The pain aches and throbs at night.  The previous cortisone shot only lasted about 1 month. Past Medical History:  Diagnosis Date   Anemia    Depression    Gastric erythema    GERD (gastroesophageal reflux disease)    High blood pressure    h/o   Migraines    Past Surgical History:  Procedure Laterality Date   CESAREAN SECTION     COLONOSCOPY N/A 12/12/2020   Procedure: COLONOSCOPY;  Surgeon: Toney Reil, MD;  Location: [facility] ENDOSCOPY;  Service: Gastroenterology;  Laterality: N/A;   ESOPHAGOGASTRODUODENOSCOPY (EGD) WITH PROPOFOL N/A 12/12/2020   Procedure: ESOPHAGOGASTRODUODENOSCOPY (EGD) WITH PROPOFOL;  Surgeon: Toney Reil, MD;  [facility] ENDOSCOPY;  Service: Gastroenterology;  Laterality: N/A;   TUBAL LIGATION     Current Outpatient Medications on File Prior to Visit  Medication Sig Dispense Refill   levonorgestrel (MIRENA, 52 MG,) 20 MCG/DAY IUD Mirena 20 mcg/24 hours (8 yrs) 52 mg intrauterine device  Take 1 device every day by intrauterine route.     omeprazole (PRILOSEC) 40 MG capsule TAKE 1 CAPSULE(40 MG) BY MOUTH DAILY BEFORE BREAKFAST 30 capsule 5   oxybutynin (DITROPAN-XL) 10 MG 24 hr tablet TAKE 1 TABLET(10 MG) BY MOUTH AT BEDTIME 90 tablet 3   rizatriptan (MAXALT) 10 MG tablet Take 1 tablet (10 mg total) by  mouth as needed for migraine. May repeat in 2 hours if needed 10 tablet 0   No current facility-administered medications on file prior to visit.   No Known Allergies Social History   Socioeconomic History   Marital status: Married    Spouse name: Not on file   Number of children: Not on file   Years of education: Not on file   Highest education level: Not on file  Occupational History   Not on file  Tobacco Use   Smoking status: Never   Smokeless tobacco: Never  Vaping Use   Vaping Use: Never used  Substance and Sexual Activity   Alcohol use: No   Drug use: No   Sexual activity: Not on file  Other Topics Concern   Not on file  Social History Narrative   Not on file   Social Determinants of Health   Financial Resource Strain: Not on file  Food Insecurity: Not on file  Transportation Needs: Not on file  Physical Activity: Not on file  Stress: Not on file  Social Connections: Not on file  Intimate Partner Violence: Not on file     Review of Systems     Objective:   Physical Exam HENT:     Mouth/Throat:     Pharynx: No oropharyngeal exudate.  Eyes:     Conjunctiva/sclera: Conjunctivae normal.  Neck:     Thyroid:  No thyromegaly.     Vascular: No JVD.  Musculoskeletal:     Left shoulder: Tenderness present. No swelling, deformity or bony tenderness. Decreased range of motion. Decreased strength.     Cervical back: Neck supple.  Lymphadenopathy:     Cervical: No cervical adenopathy.  Skin:    General: Skin is warm.     Coloration: Skin is not pale.     Findings: No erythema or rash.  Neurological:     Mental Status: She is alert and oriented to person, place, and time.     Cranial Nerves: No cranial nerve deficit.     Motor: No abnormal muscle tone.     Coordination: Coordination normal.     Deep Tendon Reflexes: Reflexes are normal and symmetric. Reflexes normal.  Psychiatric:        Behavior: Behavior normal.        Thought Content: Thought content  normal.        Judgment: Judgment normal.          Assessment & Plan:   Pure hypercholesterolemia - Plan: COMPLETE METABOLIC PANEL WITH GFR, Lipid panel  Supraspinatus tendon rupture, left, sequela Given the lack of benefit from cortisone shot as well as the pain with drop testing and empty can testing, I am concerned the patient may have a tear in her supraspinatus tendon.  I will obtain an MRI of the left shoulder to evaluate further.  Meanwhile start the patient on meloxicam 15 mg daily.  Recheck cholesterol.  Recommended avoiding fast foods and eating more fruits and vegetables

## 2022-04-22 LAB — COMPLETE METABOLIC PANEL WITH GFR
AG Ratio: 2.1 (calc) (ref 1.0–2.5)
ALT: 11 U/L (ref 6–29)
AST: 14 U/L (ref 10–35)
Albumin: 4.6 g/dL (ref 3.6–5.1)
Alkaline phosphatase (APISO): 42 U/L (ref 31–125)
BUN: 7 mg/dL (ref 7–25)
CO2: 25 mmol/L (ref 20–32)
Calcium: 9.5 mg/dL (ref 8.6–10.2)
Chloride: 109 mmol/L (ref 98–110)
Creat: 0.83 mg/dL (ref 0.50–0.99)
Globulin: 2.2 g/dL (calc) (ref 1.9–3.7)
Glucose, Bld: 91 mg/dL (ref 65–99)
Potassium: 4 mmol/L (ref 3.5–5.3)
Sodium: 141 mmol/L (ref 135–146)
Total Bilirubin: 0.6 mg/dL (ref 0.2–1.2)
Total Protein: 6.8 g/dL (ref 6.1–8.1)
eGFR: 86 mL/min/{1.73_m2} (ref >=60)

## 2022-04-22 LAB — LIPID PANEL
Cholesterol: 182 mg/dL (ref <200)
HDL: 44 mg/dL — ABNORMAL LOW (ref >=50)
LDL Cholesterol (Calc): 123 mg/dL (calc) — ABNORMAL HIGH
Non-HDL Cholesterol (Calc): 138 mg/dL (calc) — ABNORMAL HIGH (ref <130)
Total CHOL/HDL Ratio: 4.1 (calc) (ref <5.0)
Triglycerides: 60 mg/dL (ref <150)

## 2022-05-10 ENCOUNTER — Ambulatory Visit
Admission: RE | Admit: 2022-05-10 | Discharge: 2022-05-10 | Disposition: A | Discharge status: Home / Self Care | Source: Ambulatory Visit | Attending: Family Medicine | Admitting: Family Medicine

## 2022-05-10 DIAGNOSIS — S46012S Strain of muscle(s) and tendon(s) of the rotator cuff of left shoulder, sequela: Secondary | ICD-10-CM

## 2022-05-20 ENCOUNTER — Encounter

## 2022-05-28 ENCOUNTER — Other Ambulatory Visit: Payer: Self-pay

## 2022-05-28 DIAGNOSIS — M7552 Bursitis of left shoulder: Secondary | ICD-10-CM

## 2022-06-22 ENCOUNTER — Ambulatory Visit: Admitting: Sports Medicine

## 2022-07-21 ENCOUNTER — Other Ambulatory Visit: Payer: Self-pay | Admitting: Family Medicine

## 2022-08-04 ENCOUNTER — Ambulatory Visit
Admission: RE | Admit: 2022-08-04 | Discharge: 2022-08-04 | Disposition: A | Discharge status: Home / Self Care | Source: Ambulatory Visit | Attending: Obstetrics and Gynecology | Admitting: Obstetrics and Gynecology

## 2022-08-04 ENCOUNTER — Ambulatory Visit (INDEPENDENT_AMBULATORY_CARE_PROVIDER_SITE_OTHER): Admitting: Sports Medicine

## 2022-08-04 DIAGNOSIS — M75102 Unspecified rotator cuff tear or rupture of left shoulder, not specified as traumatic: Secondary | ICD-10-CM | POA: Diagnosis not present

## 2022-08-04 DIAGNOSIS — G8929 Other chronic pain: Secondary | ICD-10-CM

## 2022-08-04 DIAGNOSIS — M25512 Pain in left shoulder: Secondary | ICD-10-CM | POA: Diagnosis not present

## 2022-08-04 DIAGNOSIS — R921 Mammographic calcification found on diagnostic imaging of breast: Secondary | ICD-10-CM

## 2022-08-04 DIAGNOSIS — M12812 Other specific arthropathies, not elsewhere classified, left shoulder: Secondary | ICD-10-CM | POA: Diagnosis not present

## 2022-08-04 MED ORDER — CELECOXIB 200 MG PO CAPS: 200.0000 mg | ORAL_CAPSULE | Freq: Every day | ORAL | 1 refills | Status: DC

## 2022-08-04 MED ORDER — LIDOCAINE HCL 1 % IJ SOLN
2.0000 mL | INTRAMUSCULAR | Status: AC | PRN
Start: 2022-08-04 — End: 2022-08-04
  Administered 2022-08-04: 2 mL

## 2022-08-04 MED ORDER — METHYLPREDNISOLONE ACETATE 40 MG/ML IJ SUSP
40.0000 mg | INTRAMUSCULAR | Status: AC | PRN
Start: 2022-08-04 — End: 2022-08-04
  Administered 2022-08-04: 40 mg via INTRA_ARTICULAR

## 2022-08-04 MED ORDER — BUPIVACAINE HCL 0.25 % IJ SOLN
2.0000 mL | INTRAMUSCULAR | Status: AC | PRN
Start: 2022-08-04 — End: 2022-08-04
  Administered 2022-08-04: 2 mL via INTRA_ARTICULAR

## 2022-08-04 NOTE — Progress Notes (Signed)
MRI of left shoulder done May 2024. Pain started sometime in 2023. Has not had any physical therapy. Has been taking Meloxicam at night but it has not been helping.  She says it feels the worst at night and she has trouble even pulling her sheets up. Patient denies any popping, numbness, or tingling. Patient states that the pain stays right on top of the shoulder and does not go into her neck or down her arm.   Patient was instructed in 10 minutes of therapeutic exercises for left shoulder to improve strength, ROM and function according to my instructions and plan of care by a Certified Athletic Trainer during the office visit. A customized handout was provided and demonstration of proper technique shown and discussed. Patient did perform exercises and demonstrate understanding through teachback.  All questions discussed and answered.

## 2022-08-04 NOTE — Progress Notes (Signed)
Tammy Melton - 52 y.o. female MRN [id number]  Date of birth: 1970-10-24  Office Visit Note: Visit Date: 08/04/2022 PCP: Donita Dionis Autry, MD Referred by: Donita Lucciana Head, MD  Subjective: Chief Complaint  Patient presents with   Left Shoulder - Pain   HPI: Tammy Melton is a pleasant 52 y.o. female who presents today for acute on chronic left shoulder pain.  She has had left shoulder pain since the end of 2023.  Denies any specific injury or inciting events.  Did have a subacromial joint injection by her primary physician back in January which provided only some relief and relief only lasted for about 1 month.  She has not been any formalized physical therapy.  Has been taking meloxicam 15 mg once daily without much relief of her pain.  Pain is over the anterior lateral shoulder.  Denies any radicular symptoms, numbness tingling or weakness.  Her pain is worse at night, has trouble laying on that side.  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  States it would be difficult to get into formalized physical therapy during business hours.  Did have an MRI ordered from her primary care doctor which we did review today, see below.  Pertinent ROS were reviewed with the patient and found to be negative unless otherwise specified above in HPI.   Assessment & Plan: Visit Diagnoses:  1. Chronic left shoulder pain   2. Tear of left supraspinatus tendon   3. Rotator cuff arthropathy of left shoulder    Plan: Discussed with Jericka the nature of her chronic left shoulder pain which I believe is emanating from her rotator cuff arthropathy, mostly her tendinosis with a small tear of the supraspinatus tendon.  We did review the MRI in the room today, given the nature of the tear, I think she has the best chance at healing this starting with non-operative treatment.  We discussed formalized physical therapy, but her schedule makes this difficult.  We did print out a  customized handout for shoulder and rotator cuff strengthening and stabilization.  My athletic trainer Lequita Halt did review these in the room with her today.  She is to begin these once daily.  Given the nature of her sharp pain, we did proceed with subacromial joint injection for pain relief to aid in rehab with therapy.  She did not get a good response with meloxicam, we will change this to Celebrex 200 mg taken once daily as needed.  She will allow for 48 hours of modified rest and activity from the injection and then may begin her rehab.  I would like to see her back in about 6 weeks to see her improvements with the above.  If she is not making improvement or she wishes to proceed with formalized physical therapy, she will let me know.  Follow-up: Return in about 6 weeks (around 09/15/2022) for left shoulder pain.   Meds & Orders:  Meds ordered this encounter  Medications   celecoxib (CELEBREX) 200 MG capsule    Sig: Take 1 capsule (200 mg total) by mouth daily.    Dispense:  30 capsule    Refill:  1    Orders Placed This Encounter  Procedures   Large Joint Inj     Procedures: Large Joint Inj: L subacromial bursa on 08/04/2022 5:11 PM Indications: pain Details: 22 G 1.5 in needle, posterior approach Medications: 2 mL lidocaine 1 %; 2 mL bupivacaine 0.25 %; 40 mg methylPREDNISolone  acetate 40 MG/ML Outcome: tolerated well, no immediate complications  Subacromial Joint Injection, Left Shoulder After discussion on risks/benefits/indications, informed verbal consent was obtained. A timeout was then performed. Patient was seated on table in exam room. The patient's shoulder was prepped with betadine and alcohol swabs and utilizing posterior approach a 22G, 1.5" needle was directed anteriorly and laterally into the patient's subacromial space was injected with 2:2:1 mixture of lidocaine:bupivicaine:depomedrol with appreciation of free-flowing of the injectate into the bursal space. Patient  tolerated the procedure well without immediate complications.   Procedure, treatment alternatives, risks and benefits explained, specific risks discussed. Consent was given by the patient. Immediately prior to procedure a time out was called to verify the correct patient, procedure, equipment, support staff and site/side marked as required. Patient was prepped and draped in the usual sterile fashion.          Clinical History: No specialty comments available.  She reports that she has never smoked. She has never used smokeless tobacco. No results for input(s): "HGBA1C", "LABURIC" in the last 8760 hours.  Objective:   Vital Signs: There were no vitals taken for this visit.  Physical Exam  Gen: Well-appearing, in no acute distress; non-toxic CV:  Well-perfused. Warm.  Resp: Breathing unlabored on room air; no wheezing. Psych: Fluid speech in conversation; appropriate affect; normal thought process Neuro: Sensation intact throughout. No gross coordination deficits.   Ortho Exam - Left shoulder: No AC joint TTP or bony TTP.  There is mild pain at Codman's point.  There is full and active range of motion but some pain at endrange abduction.  There is a painful drop arm test, pain with empty can and resisted external rotation without gross weakness.  Negative speeds test, negative impingement and crossarm adduction testing.  Imaging:  *Independent review and interpretation of left shoulder MRI from 05/10/2022 was performed by myself today.  There is at least mild tendinopathy of the supraspinatus and overlying infraspinatus near the footprint, there is hypoechoic change with likely partial-thickness tearing on the bursal side of the insertional supraspinatus tendon.  There is mild to moderate AC joint arthritic change.  No joint effusion.  No full-thickness tears with retraction.  Narrative & Impression  CLINICAL DATA:  Left shoulder pain with abduction.   EXAM: MRI OF THE LEFT SHOULDER  WITHOUT CONTRAST   TECHNIQUE: Multiplanar, multisequence MR imaging of the shoulder was performed. No intravenous contrast was administered.   COMPARISON:  None Available.   FINDINGS: Rotator cuff: Mild tendinosis of the supraspinatus tendon with a small partial-thickness bursal surface tear anteriorly. Mild tendinosis of the infraspinatus tendon. Teres minor tendon is intact. Subscapularis tendon is intact.   Muscles: No muscle atrophy or edema. No intramuscular fluid collection or hematoma.   Biceps Long Head: Intraarticular and extraarticular portions of the biceps tendon are intact.   Acromioclavicular Joint: Mild arthropathy of the acromioclavicular joint. Trace subacromial/subdeltoid bursal fluid.   Glenohumeral Joint: No joint effusion. No chondral defect.   Labrum: Grossly intact, but evaluation is limited by lack of intraarticular fluid/contrast.   Bones: No fracture or dislocation. No aggressive osseous lesion.   Other: No fluid collection or hematoma.   IMPRESSION: 1. Mild tendinosis of the supraspinatus tendon with a small partial-thickness bursal surface tear anteriorly. 2. Mild tendinosis of the infraspinatus tendon.     Electronically Signed   By: Elige Ko M.D.   On: 05/15/2022 05:48    Past Medical/Family/Surgical/Social History: Medications & Allergies reviewed per EMR, new medications  updated. Patient Active Problem List   Diagnosis Date Noted   Abdominal discomfort, epigastric 02/03/2021   Chronic GERD    Endometriosis 02/14/2018   High blood pressure    Migraines    Past Medical History:  Diagnosis Date   Anemia    Depression    Gastric erythema    GERD (gastroesophageal reflux disease)    High blood pressure    h/o   Migraines    Family History  Problem Relation Age of Onset   Breast cancer Cousin    Past Surgical History:  Procedure Laterality Date   CESAREAN SECTION     COLONOSCOPY N/A 12/12/2020   Procedure:  COLONOSCOPY;  Surgeon: Toney Reil, MD;  Location: [facility] ENDOSCOPY;  Service: Gastroenterology;  Laterality: N/A;   ESOPHAGOGASTRODUODENOSCOPY (EGD) WITH PROPOFOL N/A 12/12/2020   Procedure: ESOPHAGOGASTRODUODENOSCOPY (EGD) WITH PROPOFOL;  Surgeon: Toney Reil, MD;  Location: [facility] ENDOSCOPY;  Service: Gastroenterology;  Laterality: N/A;   TUBAL LIGATION     Social History   Occupational History   Not on file  Tobacco Use   Smoking status: Never   Smokeless tobacco: Never  Vaping Use   Vaping status: Never Used  Substance and Sexual Activity   Alcohol use: No   Drug use: No   Sexual activity: Not on file

## 2022-09-11 ENCOUNTER — Encounter: Payer: Self-pay | Admitting: Pharmacist

## 2022-09-17 ENCOUNTER — Ambulatory Visit: Admitting: Sports Medicine

## 2022-09-17 ENCOUNTER — Ambulatory Visit: Admitting: Family Medicine

## 2022-09-17 ENCOUNTER — Encounter: Payer: Self-pay | Admitting: Sports Medicine

## 2022-09-17 VITALS — BP 142/94 | HR 100 | Temp 97.9°F | Ht 64.0 in | Wt 144.2 lb

## 2022-09-17 DIAGNOSIS — M75102 Unspecified rotator cuff tear or rupture of left shoulder, not specified as traumatic: Secondary | ICD-10-CM | POA: Diagnosis not present

## 2022-09-17 DIAGNOSIS — M25512 Pain in left shoulder: Secondary | ICD-10-CM

## 2022-09-17 DIAGNOSIS — G8929 Other chronic pain: Secondary | ICD-10-CM | POA: Diagnosis not present

## 2022-09-17 DIAGNOSIS — M25561 Pain in right knee: Secondary | ICD-10-CM

## 2022-09-17 NOTE — Progress Notes (Signed)
Tammy Melton - 52 y.o. female MRN [id number]  Date of birth: 06-06-1970  Office Visit Note: Visit Date: 09/17/2022 PCP: Donita Loise Esguerra, MD Referred by: Donita Allina Riches, MD  Subjective: Chief Complaint  Patient presents with   Left Shoulder - Follow-up   HPI: Tammy Melton is a pleasant 52 y.o. female who presents today for for follow-up of her left shoulder pain with known rotator cuff arthropathy.  Saw her back about 6 weeks ago and did proceed with subacromial joint injection.  We also gave her some customized home rehab exercises that she has been doing twice daily nearly every day.  She had significant reduction in her pain after the injection and has been making good progress with her home rehab.  Has transitioned from daily Celebrex and discontinued it here recently.  Pertinent ROS were reviewed with the patient and found to be negative unless otherwise specified above in HPI.   Assessment & Plan: Visit Diagnoses:  1. Chronic left shoulder pain   2. Tear of left supraspinatus tendon    Plan: Tammy Melton is much improved with her shoulder pain and function after subacromial joint injection and her home therapy.  She has been very diligent performing Twice daily, I would like her to continue this at least once to twice daily for the next 6 weeks, then as her shoulder is improving she may transition to maintenance frequency with only 2-3 times weekly.  If she is still having some pain at 6 weeks she can follow-up and we could always consider 1 additional subacromial joint injection but given her improvement we will certainly hold for now.  She may call or return sooner if any issues arise.  We discussed transitioning off of Celebrex and only using it as needed for breakthrough pain.  Follow-up: Return in about 6 weeks (around 10/29/2022), or if symptoms worsen or fail to improve.   Meds & Orders: No orders of the defined types were placed in this encounter.  No orders of the  defined types were placed in this encounter.    Procedures: No procedures performed      Clinical History: No specialty comments available.  She reports that she has never smoked. She has never used smokeless tobacco. No results for input(s): "HGBA1C", "LABURIC" in the last 8760 hours.  Objective:   Vital Signs: There were no vitals taken for this visit.  Physical Exam  Gen: Well-appearing, in no acute distress; non-toxic CV: Well-perfused. Warm.  Resp: Breathing unlabored on room air; no wheezing. Psych: Fluid speech in conversation; appropriate affect; normal thought process Neuro: Sensation intact throughout. No gross coordination deficits.   Ortho Exam - Left shoulder: There is no bony tenderness or AC joint TTP.  There is full active and passive range of motion in all directions.  There is 5/5 strength testing of the rotator cuff.  Negative drop arm and negative empty can, negative Hawkins impingement testing today.  Imaging:  Past Medical/Family/Surgical/Social History: Medications & Allergies reviewed per EMR, new medications updated. Patient Active Problem List   Diagnosis Date Noted   Abdominal discomfort, epigastric 02/03/2021   Chronic GERD    Endometriosis 02/14/2018   High blood pressure    Migraines    Past Medical History:  Diagnosis Date   Anemia    Depression    Gastric erythema    GERD (gastroesophageal reflux disease)    High blood pressure    h/o   Migraines    Family History  Problem Relation  Age of Onset   Breast cancer Cousin    Past Surgical History:  Procedure Laterality Date   CESAREAN SECTION     COLONOSCOPY N/A 12/12/2020   Procedure: COLONOSCOPY;  Surgeon: Toney Reil, MD;  Location: [facility] ENDOSCOPY;  Service: Gastroenterology;  Laterality: N/A;   ESOPHAGOGASTRODUODENOSCOPY (EGD) WITH PROPOFOL N/A 12/12/2020   Procedure: ESOPHAGOGASTRODUODENOSCOPY (EGD) WITH PROPOFOL;  Surgeon: Toney Reil, MD;  Location: [facility]  ENDOSCOPY;  Service: Gastroenterology;  Laterality: N/A;   TUBAL LIGATION     Social History   Occupational History   Not on file  Tobacco Use   Smoking status: Never   Smokeless tobacco: Never  Vaping Use   Vaping status: Never Used  Substance and Sexual Activity   Alcohol use: No   Drug use: No   Sexual activity: Not on file

## 2022-09-17 NOTE — Progress Notes (Signed)
Subjective:    Patient ID: Tammy Melton, female    DOB: [birth year], 52 y.o.   MRN: [id number]  HPI Patient presents today complaining of pain in her right knee.  The pain is located over the medial compartment.  Has been gradually getting worse.  She denies any specific injury however she history and a lot of running and also works on her feet for 8 hours every day.  She does have a small effusion in her knee today.  There is no erythema or warmth.  There is no laxity to varus or valgus stress.  She has negative anterior posterior drawer sign.  She has a negative Apley grind. Past Medical History:  Diagnosis Date   Anemia    Depression    Gastric erythema    GERD (gastroesophageal reflux disease)    High blood pressure    h/o   Migraines    Past Surgical History:  Procedure Laterality Date   CESAREAN SECTION     COLONOSCOPY N/A 12/12/2020   Procedure: COLONOSCOPY;  Surgeon: Toney Reil, MD;  Location: [facility] ENDOSCOPY;  Service: Gastroenterology;  Laterality: N/A;   ESOPHAGOGASTRODUODENOSCOPY (EGD) WITH PROPOFOL N/A 12/12/2020   Procedure: ESOPHAGOGASTRODUODENOSCOPY (EGD) WITH PROPOFOL;  Surgeon: Toney Reil, MD;  Location: [facility] ENDOSCOPY;  Service: Gastroenterology;  Laterality: N/A;   TUBAL LIGATION     Current Outpatient Medications on File Prior to Visit  Medication Sig Dispense Refill   celecoxib (CELEBREX) 200 MG capsule Take 1 capsule (200 mg total) by mouth daily. 30 capsule 1   levonorgestrel (MIRENA, 52 MG,) 20 MCG/DAY IUD Mirena 20 mcg/24 hours (8 yrs) 52 mg intrauterine device  Take 1 device every day by intrauterine route.     omeprazole (PRILOSEC) 40 MG capsule TAKE 1 CAPSULE(40 MG) BY MOUTH DAILY BEFORE BREAKFAST 30 capsule 5   oxybutynin (DITROPAN-XL) 10 MG 24 hr tablet TAKE 1 TABLET(10 MG) BY MOUTH AT BEDTIME 90 tablet 3   No current facility-administered medications on file prior to visit.   No Known Allergies Social History   Socioeconomic  History   Marital status: Married    Spouse name: Not on file   Number of children: Not on file   Years of education: Not on file   Highest education level: Associate degree: academic program  Occupational History   Not on file  Tobacco Use   Smoking status: Never   Smokeless tobacco: Never  Vaping Use   Vaping status: Never Used  Substance and Sexual Activity   Alcohol use: No   Drug use: No   Sexual activity: Not on file  Other Topics Concern   Not on file  Social History Narrative   Not on file   Social Determinants of Health   Financial Resource Strain: Low Risk  (09/16/2022)   Overall Financial Resource Strain (CARDIA)    Difficulty of Paying Living Expenses: Not hard at all  Food Insecurity: No Food Insecurity (09/16/2022)   Hunger Vital Sign    Worried About Running Out of Food in the Last Year: Never true    Ran Out of Food in the Last Year: Never true  Transportation Needs: No Transportation Needs (09/16/2022)   PRAPARE - Administrator, Civil Service (Medical): No    Lack of Transportation (Non-Medical): No  Physical Activity: Unknown (09/16/2022)   Exercise Vital Sign    Days of Exercise per Week: 0 days    Minutes of Exercise per Session: Not on file  Stress: No Stress Concern Present (09/16/2022)   [employer] of Occupational Health - Occupational Stress Questionnaire    Feeling of Stress : Not at all  Social Connections: Moderately Integrated (09/16/2022)   Social Connection and Isolation Panel [NHANES]    Frequency of Communication with Friends and Family: More than three times a week    Frequency of Social Gatherings with Friends and Family: Not on file    Attends Religious Services: More than 4 times per year    Active Member of [employer] or Organizations: No    Attends Engineer, structural: Not on file    Marital Status: Married  Catering manager Violence: Not on file     Review of Systems     Objective:   Physical  Exam HENT:     Mouth/Throat:     Pharynx: No oropharyngeal exudate.  Eyes:     Conjunctiva/sclera: Conjunctivae normal.  Neck:     Thyroid: No thyromegaly.     Vascular: No JVD.  Musculoskeletal:     Cervical back: Neck supple.     Right knee: Effusion present. Normal range of motion. Tenderness present over the medial joint line. No LCL laxity, MCL laxity, ACL laxity or PCL laxity. Normal meniscus.  Lymphadenopathy:     Cervical: No cervical adenopathy.  Skin:    General: Skin is warm.     Coloration: Skin is not pale.     Findings: No erythema or rash.  Neurological:     Mental Status: She is alert and oriented to person, place, and time.     Cranial Nerves: No cranial nerve deficit.     Motor: No abnormal muscle tone.     Coordination: Coordination normal.     Deep Tendon Reflexes: Reflexes are normal and symmetric. Reflexes normal.  Psychiatric:        Behavior: Behavior normal.        Thought Content: Thought content normal.        Judgment: Judgment normal.          Assessment & Plan:  Acute pain of right knee - Plan: DG Knee Complete 4 Views Right Believe the pain is osteoarthritis..  Patient has tried and failed NSAIDs.  She agrees to receive a cortisone injection.  Using sterile technique, I injected the right knee with 2 cc of lidocaine, 2 cc of Marcaine, and 2 cc of 40 mg/mL Kenalog.  The patient tolerated the procedure well without complication.

## 2022-09-17 NOTE — Progress Notes (Signed)
Patient states she is feeling much better than she was at her last visit. She said that if she had to rank her pain on a scale of 1-10 today it would be a 2. She says that her home exercises have been going well. Patient states that she stopped taking the prescribed medication 2-3 weeks ago to see how she felt and pain did increase some in the time since then.

## 2022-09-18 IMAGING — MG DIGITAL DIAGNOSTIC BILAT W/ TOMO W/ CAD
6 of 10 series · 6 of 26 positions shown · non-contrast
Comparison: Previous exam(s).

CLINICAL DATA: Follow-up for probably benign calcifications in the
RIGHT breast. These calcifications were initially identified on
baseline screening mammogram dated 12/22/2019.

EXAM:
DIGITAL DIAGNOSTIC BILATERAL MAMMOGRAM WITH TOMOSYNTHESIS AND CAD
TECHNIQUE: Bilateral digital diagnostic mammography and breast tomosynthesis
was performed. The images were evaluated with computer-aided
detection.

[R ML]
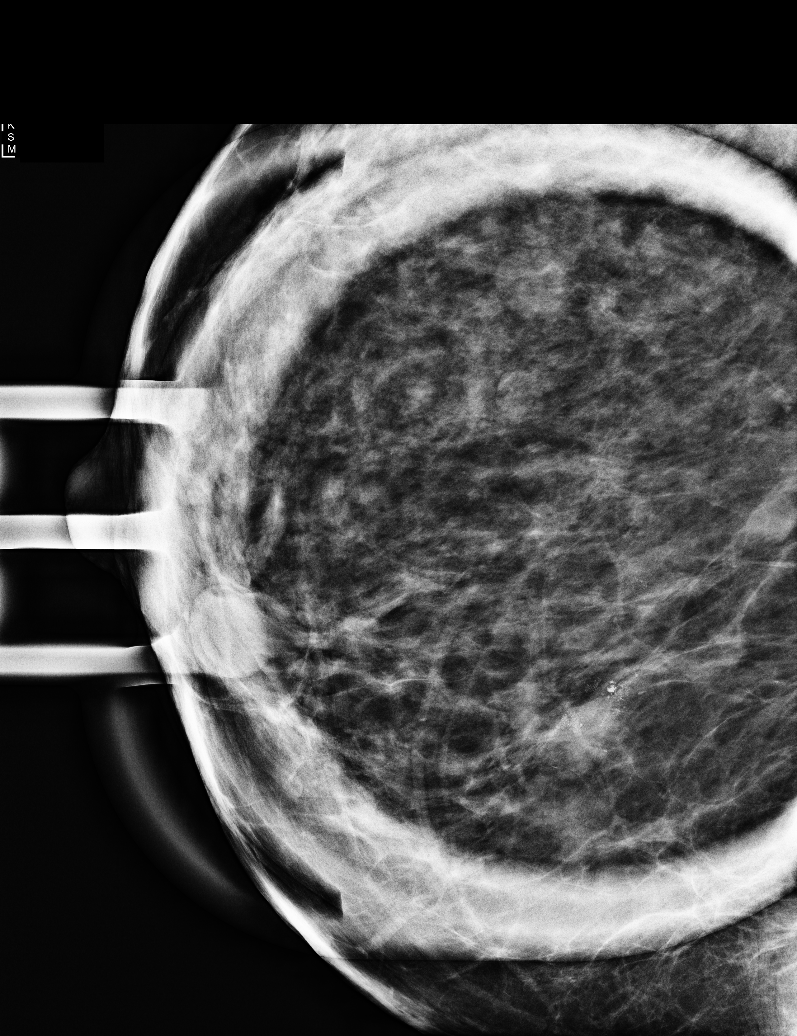

[R CC]
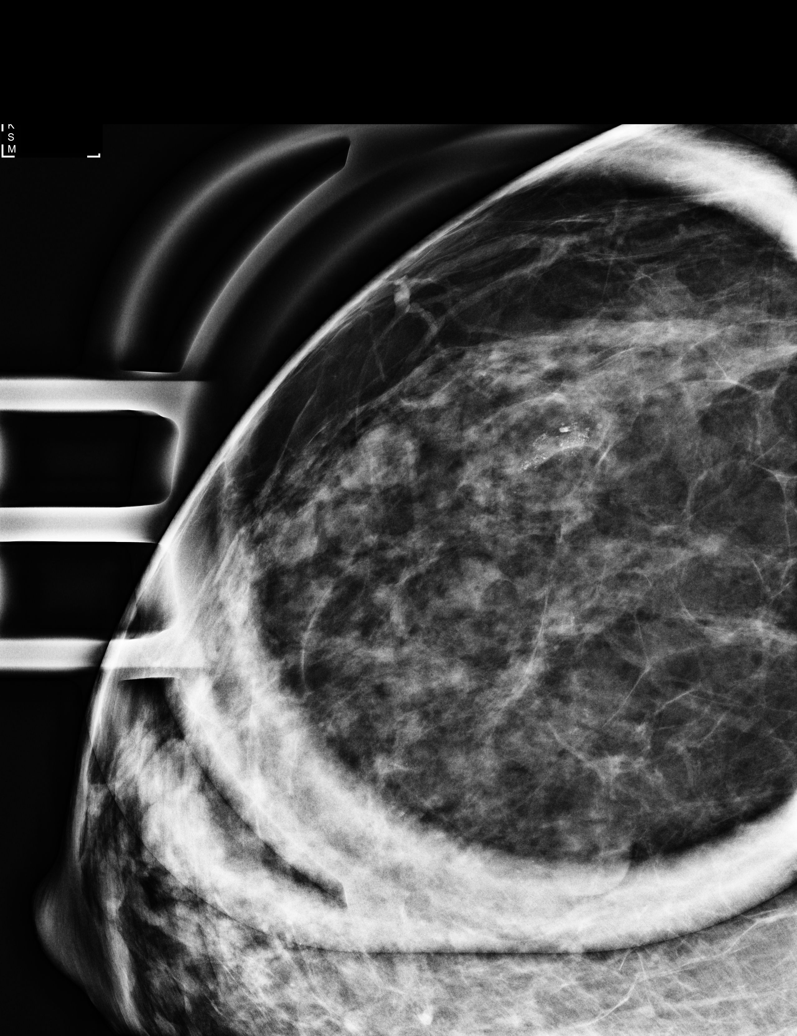

[L CC synth-2D]
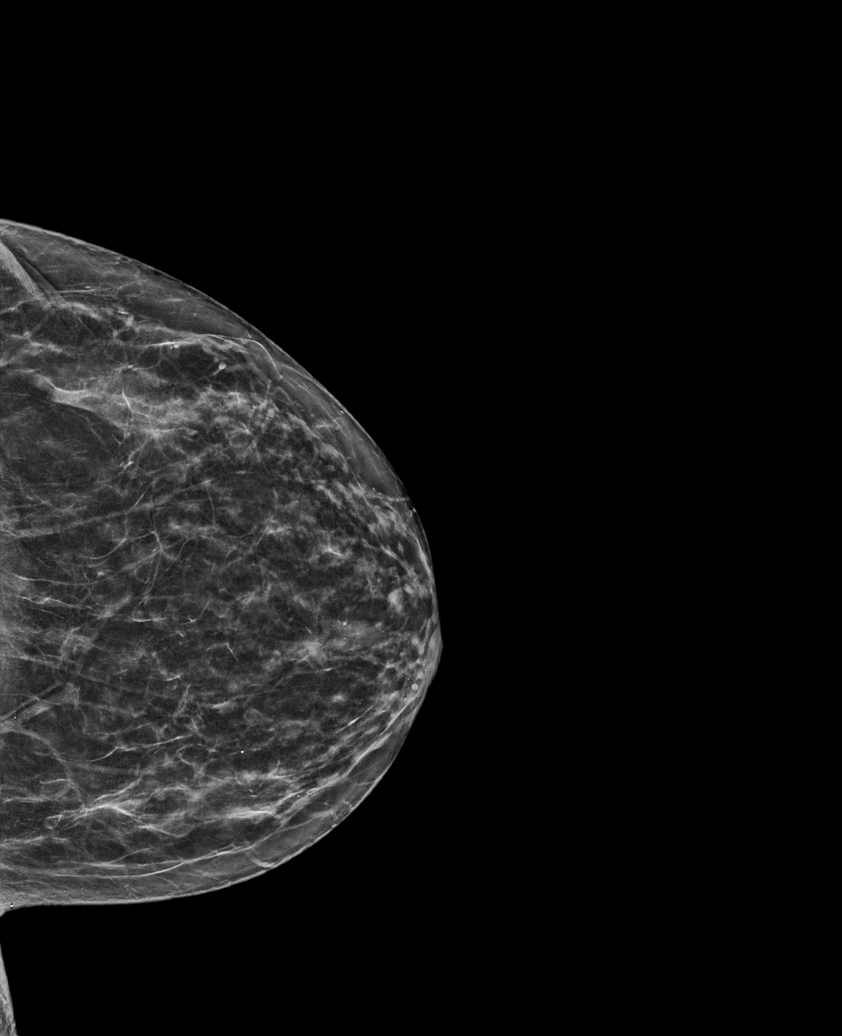

[L MLO synth-2D]
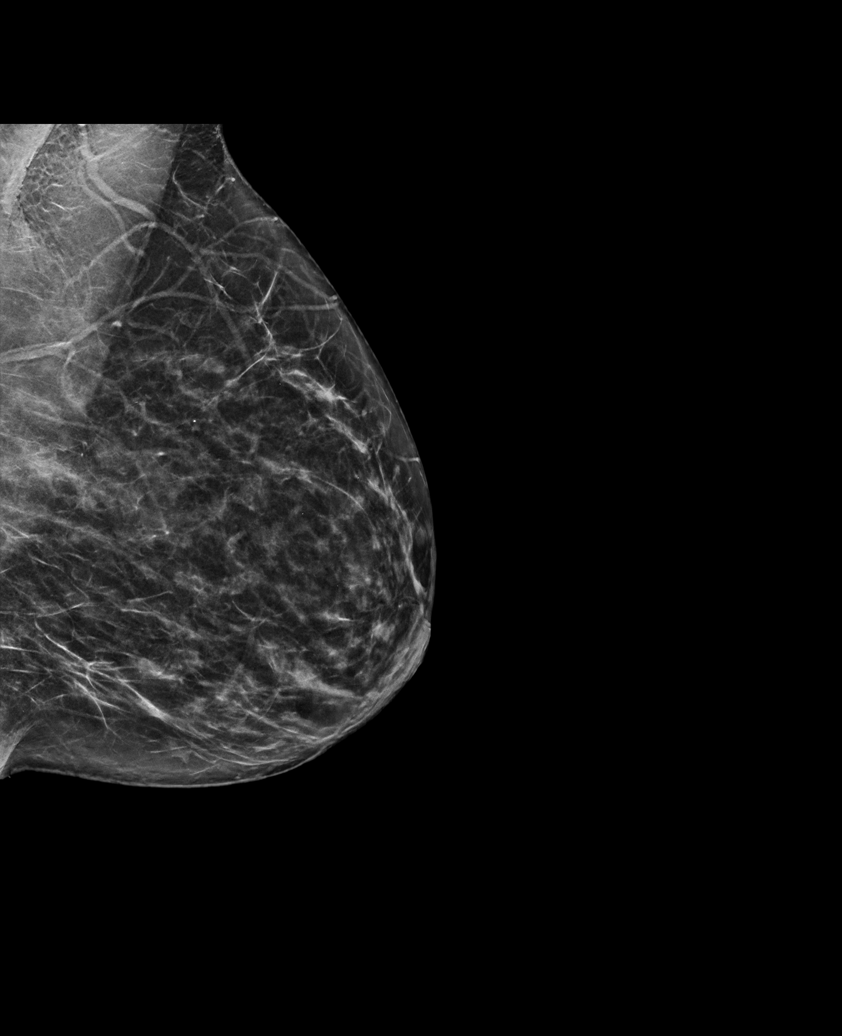

[R CC synth-2D]
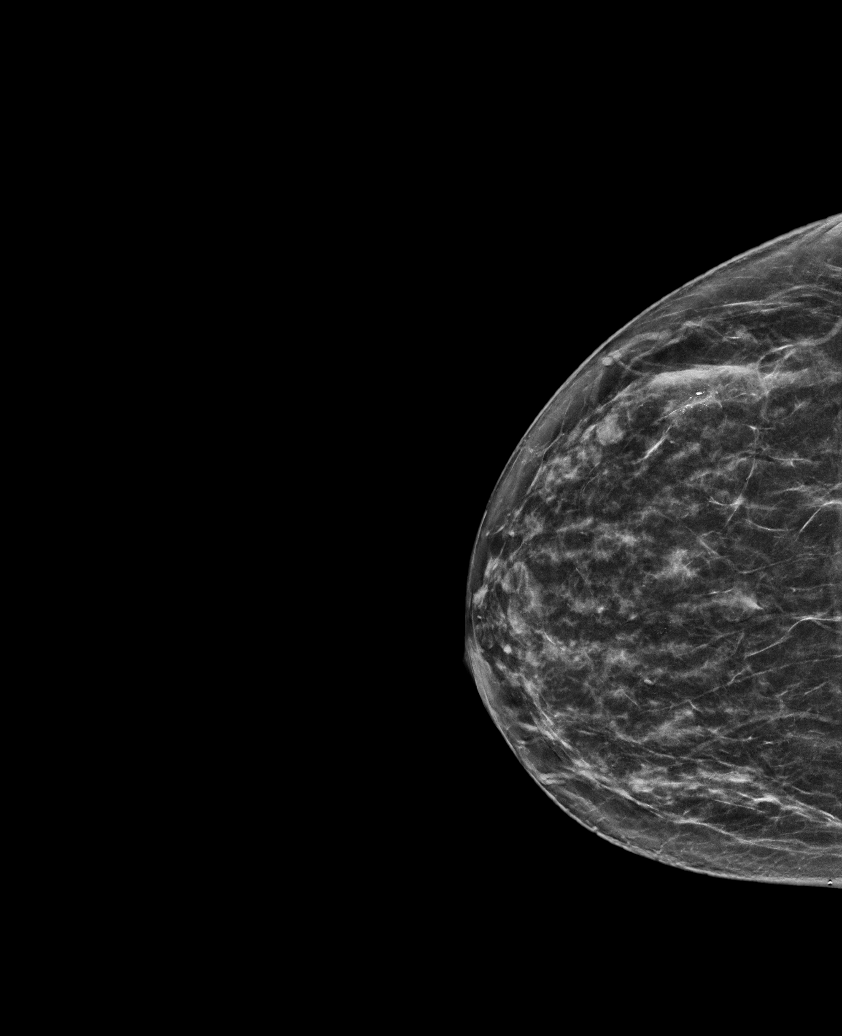

[R MLO synth-2D]
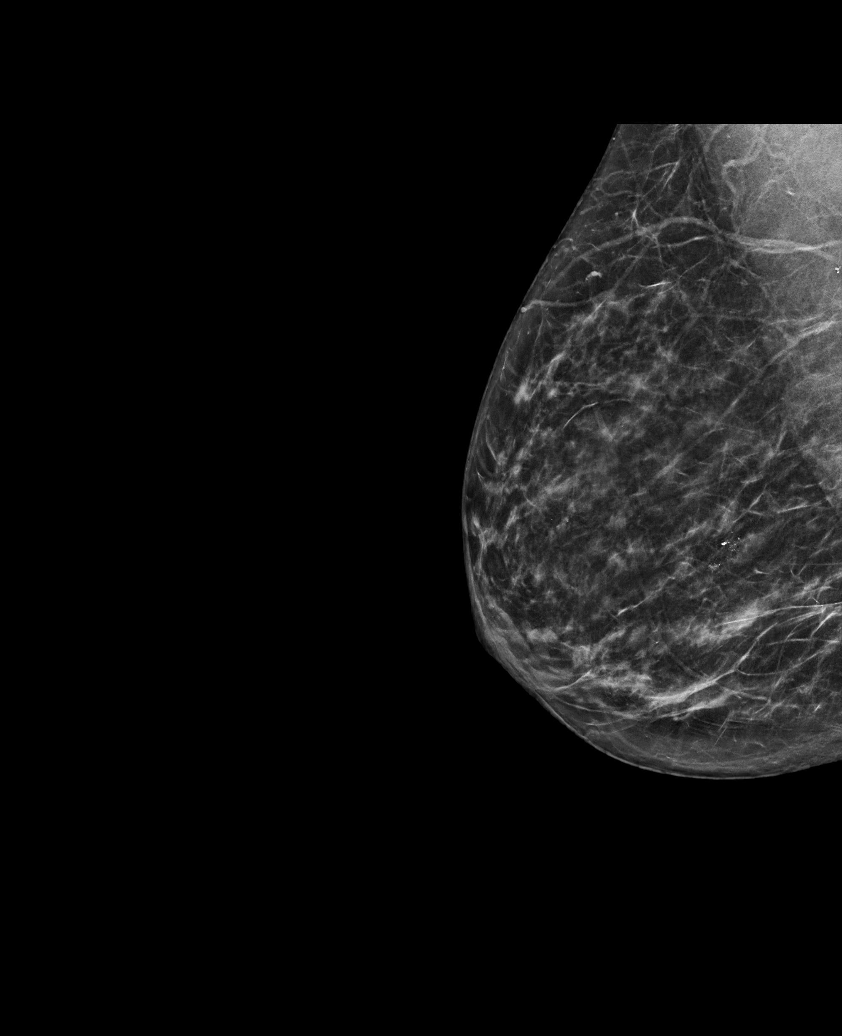

[6 of 26 positions shown; findings below may reference images not displayed]

ACR Breast Density Category c: The breast tissue is heterogeneously
dense, which may obscure small masses.
FINDINGS: The RIGHT breast calcifications are stable. There are no new
dominant masses, suspicious calcifications or secondary signs of
malignancy identified elsewhere within either breast.
IMPRESSION: 1. Stable probably benign calcifications within the outer RIGHT
breast. Recommend additional follow-up diagnostic mammogram in 12
months to ensure 2 year stability.
2. No evidence of malignancy within the LEFT breast.

RECOMMENDATION:
Bilateral diagnostic mammogram, with RIGHT breast magnification
views, in 12 months.

I have discussed the findings and recommendations with the patient.
If applicable, a reminder letter will be sent to the patient
regarding the next appointment.

BI-RADS CATEGORY  3: Probably benign.

## 2022-09-22 ENCOUNTER — Ambulatory Visit
Admission: RE | Admit: 2022-09-22 | Discharge: 2022-09-22 | Disposition: A | Discharge status: Home / Self Care | Source: Ambulatory Visit | Attending: Family Medicine | Admitting: Family Medicine

## 2022-09-22 ENCOUNTER — Telehealth: Payer: Self-pay | Admitting: Family Medicine

## 2022-09-22 DIAGNOSIS — M25561 Pain in right knee: Secondary | ICD-10-CM

## 2022-09-22 NOTE — Telephone Encounter (Signed)
Patient called to follow up on xray order; stated she went to [facility] on [address] and they didn't have the order. Patient requesting for order to be faxed to them.   Please advise patient at [phone].

## 2022-10-02 MED ORDER — LIDOCAINE HCL (PF) 1 % IJ SOLN
2.0000 mL | Freq: Once | INTRAMUSCULAR | Status: AC
Start: 2022-10-02 — End: 2022-09-17
  Administered 2022-09-17: 2 mL

## 2022-10-02 MED ORDER — BUPIVACAINE HCL 0.25 % IJ SOLN
2.0000 mL | Freq: Once | INTRAMUSCULAR | Status: AC
Start: 2022-10-02 — End: 2022-09-17
  Administered 2022-09-17: 2 mL via INTRA_ARTICULAR

## 2022-10-02 MED ORDER — TRIAMCINOLONE ACETONIDE 40 MG/ML IJ SUSP
80.0000 mg | Freq: Once | INTRAMUSCULAR | Status: AC
Start: 2022-10-02 — End: 2022-09-17
  Administered 2022-09-17: 80 mg via INTRA_ARTICULAR

## 2022-10-02 NOTE — Addendum Note (Signed)
Addended by: Venia Carbon K on: 10/02/2022 04:14 PM   Modules accepted: Orders

## 2022-10-05 ENCOUNTER — Ambulatory Visit: Payer: Self-pay

## 2022-10-05 NOTE — Telephone Encounter (Signed)
  Chief Complaint: Itching rash on neck and face Symptoms: above Frequency: Friday afternoon Pertinent Negatives: Patient denies difficulty breathing, mouth swelling, eye swelling Disposition: [] ED /[] Urgent Care (no appt availability in office) / [x] Appointment(In office/virtual)/ []  [facility]/ [] Home Care/ [] Refused Recommended Disposition /[] [facility]/ []  Follow-up with PCP Additional Notes: Pt states that she developed itching and minor swelling Friday afternoon. Itchiness of on left side of neck and goes up to her cheek. Pt does not see a rash.  Pt is using Hydrocortisone cream with moderate relief. Itchiness does return. Pt will try an antihistamine as well. Reviewed home care advise. Pt will continue to monitor s/s and will call 911 if she develops s/s indicating the reaction is becoming life threatening.   Reason for Disposition  [1] MODERATE-SEVERE local itching (i.e., interferes with work, school, activities) AND [2] not improved after 24 hours of hydrocortisone cream  Answer Assessment - Initial Assessment Questions 1. DESCRIPTION: "Describe the itching you are having." "Where is it located?"     Neck check and chin 2. SEVERITY: "How bad is it?"    - MILD: Doesn't interfere with normal activities.   - MODERATE-SEVERE: Interferes with work, school, sleep, or other activities.      moderate 4. ONSET: "When did the itching begin?"      Friday afternoon 5. CAUSE: "What do you think is causing the itching?"      no 6. OTHER SYMPTOMS: "Do you have any other symptoms?"      Swelling  Protocols used: Itching - Localized-A-AH

## 2022-10-09 ENCOUNTER — Ambulatory Visit: Admitting: Family Medicine

## 2022-10-09 DIAGNOSIS — L509 Urticaria, unspecified: Secondary | ICD-10-CM | POA: Diagnosis not present

## 2022-10-09 MED ORDER — PREDNISONE 20 MG PO TABS
ORAL_TABLET | ORAL | 0 refills | Status: AC
Start: 2022-10-09 — End: ?

## 2022-10-09 MED ORDER — CETIRIZINE HCL 10 MG PO TABS
10.0000 mg | ORAL_TABLET | Freq: Two times a day (BID) | ORAL | 1 refills | Status: AC
Start: 2022-10-09 — End: ?

## 2022-10-09 NOTE — Progress Notes (Signed)
Subjective:    Patient ID: Tammy Melton, female    DOB: 1970-10-26, 52 y.o.   MRN: [id number]  HPI 1 week history of itching and erythematous hives and urticaria on face, neck, chest, arms, and thighs.  No new ingestion.  Did have permanent crown placed in mouth last week.  No sob, wheezing  Past Medical History:  Diagnosis Date   Anemia    Depression    Gastric erythema    GERD (gastroesophageal reflux disease)    High blood pressure    h/o   Migraines    Past Surgical History:  Procedure Laterality Date   CESAREAN SECTION     COLONOSCOPY N/A 12/12/2020   Procedure: COLONOSCOPY;  Surgeon: Toney Reil, MD;  Location: [facility] ENDOSCOPY;  Service: Gastroenterology;  Laterality: N/A;   ESOPHAGOGASTRODUODENOSCOPY (EGD) WITH PROPOFOL N/A 12/12/2020   Procedure: ESOPHAGOGASTRODUODENOSCOPY (EGD) WITH PROPOFOL;  Surgeon: Toney Reil, MD;  Location: [facility] ENDOSCOPY;  Service: Gastroenterology;  Laterality: N/A;   TUBAL LIGATION     Current Outpatient Medications on File Prior to Visit  Medication Sig Dispense Refill   celecoxib (CELEBREX) 200 MG capsule Take 1 capsule (200 mg total) by mouth daily. (Patient not taking: Reported on 09/17/2022) 30 capsule 1   levonorgestrel (MIRENA, 52 MG,) 20 MCG/DAY IUD Mirena 20 mcg/24 hours (8 yrs) 52 mg intrauterine device  Take 1 device every day by intrauterine route.     omeprazole (PRILOSEC) 40 MG capsule TAKE 1 CAPSULE(40 MG) BY MOUTH DAILY BEFORE BREAKFAST (Patient not taking: Reported on 09/17/2022) 30 capsule 5   oxybutynin (DITROPAN-XL) 10 MG 24 hr tablet TAKE 1 TABLET(10 MG) BY MOUTH AT BEDTIME 90 tablet 3   No current facility-administered medications on file prior to visit.   No Known Allergies Social History   Socioeconomic History   Marital status: Married    Spouse name: Not on file   Number of children: Not on file   Years of education: Not on file   Highest education level: Associate degree: academic program   Occupational History   Not on file  Tobacco Use   Smoking status: Never   Smokeless tobacco: Never  Vaping Use   Vaping status: Never Used  Substance and Sexual Activity   Alcohol use: No   Drug use: No   Sexual activity: Not on file  Other Topics Concern   Not on file  Social History Narrative   Not on file   Social Determinants of Health   Financial Resource Strain: Low Risk  (09/16/2022)   Overall Financial Resource Strain (CARDIA)    Difficulty of Paying Living Expenses: Not hard at all  Food Insecurity: No Food Insecurity (09/16/2022)   Hunger Vital Sign    Worried About Running Out of Food in the Last Year: Never true    Ran Out of Food in the Last Year: Never true  Transportation Needs: No Transportation Needs (09/16/2022)   PRAPARE - Administrator, Civil Service (Medical): No    Lack of Transportation (Non-Medical): No  Physical Activity: Unknown (09/16/2022)   Exercise Vital Sign    Days of Exercise per Week: 0 days    Minutes of Exercise per Session: Not on file  Stress: No Stress Concern Present (09/16/2022)   [employer] of Occupational Health - Occupational Stress Questionnaire    Feeling of Stress : Not at all  Social Connections: Moderately Integrated (09/16/2022)   Social Connection and Isolation Panel [NHANES]    Frequency  of Communication with Friends and Family: More than three times a week    Frequency of Social Gatherings with Friends and Family: Not on file    Attends Religious Services: More than 4 times per year    Active Member of [employer] or Organizations: No    Attends Engineer, structural: Not on file    Marital Status: Married  Catering manager Violence: Not on file     Review of Systems     Objective:   Physical Exam HENT:     Mouth/Throat:     Pharynx: No oropharyngeal exudate.  Eyes:     Conjunctiva/sclera: Conjunctivae normal.  Neck:     Thyroid: No thyromegaly.     Vascular: No JVD.  Musculoskeletal:      Cervical back: Neck supple.  Lymphadenopathy:     Cervical: No cervical adenopathy.  Skin:    General: Skin is warm.     Coloration: Skin is not pale.     Findings: Erythema and rash present. Rash is urticarial.       Neurological:     Mental Status: She is alert and oriented to person, place, and time.     Cranial Nerves: No cranial nerve deficit.     Motor: No abnormal muscle tone.     Coordination: Coordination normal.     Deep Tendon Reflexes: Reflexes are normal and symmetric. Reflexes normal.  Psychiatric:        Behavior: Behavior normal.        Thought Content: Thought content normal.        Judgment: Judgment normal.          Assessment & Plan:  Urticaria - Plan: predniSONE (DELTASONE) 20 MG tablet, cetirizine (ZYRTEC) 10 MG tablet, Alpha-Gal Panel Prednisone taper pack with zyrtec 10 mg pobid.  Check for alpha gal.  Recheck next week.

## 2022-10-15 LAB — ALPHA-GAL PANEL
Allergen, Mutton, f88: <0.1 kU/L
Allergen, Pork, f26: <0.1 kU/L
Beef: <0.1 kU/L
CLASS: 0
CLASS: 0
Class: 0
GALACTOSE-ALPHA-1,3-GALACTOSE IGE*: <0.1 kU/L (ref <0.10)

## 2022-10-15 LAB — INTERPRETATION:

## 2022-10-17 ENCOUNTER — Other Ambulatory Visit: Payer: Self-pay | Admitting: Family Medicine

## 2022-10-17 DIAGNOSIS — L509 Urticaria, unspecified: Secondary | ICD-10-CM

## 2022-10-18 ENCOUNTER — Other Ambulatory Visit: Payer: Self-pay | Admitting: Sports Medicine

## 2022-10-19 ENCOUNTER — Ambulatory Visit: Admitting: Family Medicine

## 2022-10-19 VITALS — BP 132/76 | HR 86 | Temp 98.3°F | Ht 64.0 in | Wt 146.0 lb

## 2022-10-19 DIAGNOSIS — L509 Urticaria, unspecified: Secondary | ICD-10-CM | POA: Diagnosis not present

## 2022-10-19 MED ORDER — PREDNISONE 20 MG PO TABS
ORAL_TABLET | ORAL | 0 refills | Status: AC
Start: 2022-10-19 — End: ?

## 2022-10-19 NOTE — Progress Notes (Signed)
Subjective:    Patient ID: Tammy Melton, female    DOB: [birth year], 52 y.o.   MRN: [id number]  Rash  10/09/22 1 week history of itching and erythematous hives and urticaria on face, neck, chest, arms, and thighs.  No new ingestion.  Did have permanent crown placed in mouth last week.  No sob, wheezing.  At that time, my plan was: Prednisone taper pack with zyrtec 10 mg pobid.  Check for alpha gal.  Recheck next week.   10/19/22 Patient states that while she took the prednisone, the rash totally subsided.  However a day or so after stopping the prednisone and the rash returned.  Today the patient has swelling around both eyes and in her face.  There is no swelling around her lips.  She denies any shortness of breath or wheezing or stridor.  She also has urticaria on her arms and on her upper torso.  She also has urticaria on her thighs.  It spares her abdomen and her back.  She is taking Celebrex and fish oil.  She denies any new exposures.  She is not around latex.  Alpha gal panel was negative.  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  Past Medical History:  Diagnosis Date   Anemia    Depression    Gastric erythema    GERD (gastroesophageal reflux disease)    High blood pressure    h/o   Migraines    Past Surgical History:  Procedure Laterality Date   CESAREAN SECTION     COLONOSCOPY N/A 12/12/2020   Procedure: COLONOSCOPY;  Surgeon: Toney Reil, MD;  Location: [facility] ENDOSCOPY;  Service: Gastroenterology;  Laterality: N/A;   ESOPHAGOGASTRODUODENOSCOPY (EGD) WITH PROPOFOL N/A 12/12/2020   Procedure: ESOPHAGOGASTRODUODENOSCOPY (EGD) WITH PROPOFOL;  Surgeon: Toney Reil, MD;  Location: [facility] ENDOSCOPY;  Service: Gastroenterology;  Laterality: N/A;   TUBAL LIGATION     Current Outpatient Medications on File Prior to Visit  Medication Sig Dispense Refill   celecoxib (CELEBREX) 200 MG capsule Take 1 capsule (200 mg total) by mouth daily. (Patient not taking:  Reported on 09/17/2022) 30 capsule 1   cetirizine (ZYRTEC) 10 MG tablet Take 1 tablet (10 mg total) by mouth 2 (two) times daily. 60 tablet 1   levonorgestrel (MIRENA, 52 MG,) 20 MCG/DAY IUD Mirena 20 mcg/24 hours (8 yrs) 52 mg intrauterine device  Take 1 device every day by intrauterine route.     omeprazole (PRILOSEC) 40 MG capsule TAKE 1 CAPSULE(40 MG) BY MOUTH DAILY BEFORE BREAKFAST (Patient not taking: Reported on 09/17/2022) 30 capsule 5   oxybutynin (DITROPAN-XL) 10 MG 24 hr tablet TAKE 1 TABLET(10 MG) BY MOUTH AT BEDTIME 90 tablet 3   predniSONE (DELTASONE) 20 MG tablet 3 tabs poqday 1-2, 2 tabs poqday 3-4, 1 tab poqday 5-6 12 tablet 0   No current facility-administered medications on file prior to visit.   No Known Allergies Social History   Socioeconomic History   Marital status: Married    Spouse name: Not on file   Number of children: Not on file   Years of education: Not on file   Highest education level: Associate degree: academic program  Occupational History   Not on file  Tobacco Use   Smoking status: Never   Smokeless tobacco: Never  Vaping Use   Vaping status: Never Used  Substance and Sexual Activity   Alcohol use: No   Drug use: No   Sexual activity: Not on file  Other Topics Concern   Not on file  Social History Narrative   Not on file   Social Determinants of Health   Financial Resource Strain: Low Risk  (09/16/2022)   Overall Financial Resource Strain (CARDIA)    Difficulty of Paying Living Expenses: Not hard at all  Food Insecurity: No Food Insecurity (09/16/2022)   Hunger Vital Sign    Worried About Running Out of Food in the Last Year: Never true    Ran Out of Food in the Last Year: Never true  Transportation Needs: No Transportation Needs (09/16/2022)   PRAPARE - Administrator, Civil Service (Medical): No    Lack of Transportation (Non-Medical): No  Physical Activity: Unknown (09/16/2022)   Exercise Vital Sign    Days of Exercise per  Week: 0 days    Minutes of Exercise per Session: Not on file  Stress: No Stress Concern Present (09/16/2022)   [employer] of Occupational Health - Occupational Stress Questionnaire    Feeling of Stress : Not at all  Social Connections: Moderately Integrated (09/16/2022)   Social Connection and Isolation Panel [NHANES]    Frequency of Communication with Friends and Family: More than three times a week    Frequency of Social Gatherings with Friends and Family: Not on file    Attends Religious Services: More than 4 times per year    Active Member of [employer] or Organizations: No    Attends Engineer, structural: Not on file    Marital Status: Married  Catering manager Violence: Not on file     Review of Systems  Skin:  Positive for rash.       Objective:   Physical Exam HENT:     Mouth/Throat:     Pharynx: No oropharyngeal exudate.  Eyes:     Conjunctiva/sclera: Conjunctivae normal.  Neck:     Thyroid: No thyromegaly.     Vascular: No JVD.  Musculoskeletal:     Cervical back: Neck supple.  Lymphadenopathy:     Cervical: No cervical adenopathy.  Skin:    General: Skin is warm.     Coloration: Skin is not pale.     Findings: Erythema and rash present. Rash is urticarial.       Neurological:     Mental Status: She is alert and oriented to person, place, and time.     Cranial Nerves: No cranial nerve deficit.     Motor: No abnormal muscle tone.     Coordination: Coordination normal.     Deep Tendon Reflexes: Reflexes are normal and symmetric. Reflexes normal.  Psychiatric:        Behavior: Behavior normal.        Thought Content: Thought content normal.        Judgment: Judgment normal.          Assessment & Plan:  Urticaria - Plan: Allergy Panel 16, Vegetable Group, Allergy Panel 18, Nut Mix Group, Allergy Panel 19, Seafood Group, Ambulatory referral to Allergy, predniSONE (DELTASONE) 20 MG tablet, Food Allergy Profile Discontinue Celebrex and fish oil.   Obtain food allergy profiles.  Present daily.  Labs reviewed rash returns after discontinuation of medication, I will consult an allergist for further testing.  Suspect Celebrex versus a food allergy

## 2022-10-21 LAB — ALLERGY PANEL 19, SEAFOOD GROUP
Allergen, Salmon, f41: <0.1 kU/L
CLASS: 0
CLASS: 0
CLASS: 0
CLASS: 0
CLASS: 0
Class: 0
Crab: <0.1 kU/L
Fish Cod: <0.1 kU/L
Lobster: <0.1 kU/L
Shrimp IgE: <0.1 kU/L
Tuna IgE: <0.1 kU/L

## 2022-10-21 LAB — FOOD ALLERGY PROFILE
Allergen, Salmon, f41: <0.1 kU/L
Almonds: <0.1 kU/L
CLASS: 0
CLASS: 0
CLASS: 0
CLASS: 0
CLASS: 0
CLASS: 0
CLASS: 0
CLASS: 0
CLASS: 0
CLASS: 0
Cashew IgE: <0.1 kU/L
Class: 0
Class: 0
Class: 0
Class: 0
Egg White IgE: <0.1 kU/L
Fish Cod: <0.1 kU/L
Hazelnut: <0.1 kU/L
Milk IgE: <0.1 kU/L
Peanut IgE: <0.1 kU/L
Scallop IgE: <0.1 kU/L
Sesame Seed f10: 0.12 kU/L — ABNORMAL HIGH
Shrimp IgE: <0.1 kU/L
Soybean IgE: <0.1 kU/L
Tuna IgE: <0.1 kU/L
Walnut: <0.1 kU/L
Wheat IgE: <0.1 kU/L

## 2022-10-21 LAB — ALLERGY PANEL 16, VEGETABLE GROUP
Allergen Pea f12: <0.1 kU/L
Allergen, White Bean, f15: <0.1 kU/L
Allergen, White Potato,f35: <0.1 kU/L
CLASS: 0
CLASS: 0
Carrot: <0.1 kU/L
Class: 0
Class: 0
Corn: <0.1 kU/L
NR Class f15: 0

## 2022-10-21 LAB — ALLERGY PANEL 18, NUT MIX GROUP
Almonds: <0.1 kU/L
CLASS: 0
CLASS: 0
CLASS: 0
CLASS: 0
CLASS: 0
Cashew IgE: <0.1 kU/L
Class: 0
Coconut: <0.1 kU/L
Hazelnut: <0.1 kU/L
Peanut IgE: <0.1 kU/L
Pecan Nut: <0.1 kU/L
Sesame Seed f10: 0.12 kU/L — ABNORMAL HIGH

## 2022-10-21 LAB — INTERPRETATION:

## 2022-12-01 ENCOUNTER — Other Ambulatory Visit: Payer: Self-pay

## 2022-12-01 ENCOUNTER — Encounter: Payer: Self-pay | Admitting: Allergy & Immunology

## 2022-12-01 ENCOUNTER — Ambulatory Visit: Admitting: Allergy & Immunology

## 2022-12-01 VITALS — BP 144/86 | HR 106 | Temp 98.2°F | Resp 18 | Ht 64.17 in | Wt 148.4 lb

## 2022-12-01 DIAGNOSIS — L2089 Other atopic dermatitis: Secondary | ICD-10-CM | POA: Diagnosis not present

## 2022-12-01 DIAGNOSIS — L508 Other urticaria: Secondary | ICD-10-CM | POA: Diagnosis not present

## 2022-12-01 MED ORDER — LEVOCETIRIZINE DIHYDROCHLORIDE 5 MG PO TABS: 5.0000 mg | ORAL_TABLET | Freq: Two times a day (BID) | ORAL | 1 refills | Status: DC | PRN

## 2022-12-01 MED ORDER — TRIAMCINOLONE ACETONIDE 0.1 % EX OINT: 1.0000 | TOPICAL_OINTMENT | Freq: Two times a day (BID) | CUTANEOUS | 2 refills | Status: AC

## 2022-12-01 NOTE — Patient Instructions (Addendum)
1. Chronic urticaria - Your history does not have any "red flags" such as fevers, joint pains, or permanent skin changes that would be concerning for a more serious cause of hives.  - We will get some labs to rule out serious causes of hives: complete blood count, tryptase level, chronic urticaria panel, CMP, ESR, and CRP.  - Likely this was a nothing burger that won't be a problem again in the future.  - Chronic hives are often times a self limited process and will "burn themselves out" over 6-12 months, although this is not always the case.  - In the meantime, start suppressive dosing of antihistamines at the FIRST SIGN of problems:   - Morning: Xyzal (levocetirizine) 5-10mg  (one to two tablets)  - Evening: Xyzal (levocetirizine) 5-10mg  (one to two tablets)  - If the above is not working, try adding: Pepcid (famotidine) 20mg  - You can change this dosing at home, decreasing the dose as needed or increasing the dosing as needed.   2. Eczema - Continue with the topical steroid as needed, but we will give you a stronger one called triamcinolone.  - DO NOT use this on the face.   3. Return in about 6 months (around 05/31/2023). You can have the follow up appointment with Dr. Dellis Anes or a Nurse Practicioner (our Nurse Practitioners are excellent and always have Physician oversight!).    Please inform us of any Emergency Department visits, hospitalizations, or changes in symptoms. Call us before going to the ED for breathing or allergy symptoms since we might be able to fit you in for a sick visit. Feel free to contact us anytime with any questions, problems, or concerns.  It was a pleasure to meet you today!  Websites that have reliable patient information: 1. American Academy of Asthma, Allergy, and Immunology: www.aaaai.org 2. Food Allergy Research and Education (FARE): foodallergy.org 3. Mothers of Asthmatics: http://www.asthmacommunitynetwork.org 4. American College of Allergy, Asthma, and  Immunology: www.acaai.org      "Like" [place] on Facebook and Instagram for our latest updates!      A healthy democracy works best when [employer] participate! Make sure you are registered to vote! If you have moved or changed any of your contact information, you will need to get this updated before voting! Scan the QR codes below to learn more!

## 2022-12-01 NOTE — Progress Notes (Signed)
NEW PATIENT  Date of Service/Encounter:  12/01/22  Consult requested by: Donita Brooks, MD   Assessment:   Chronic urticaria - resolved reports today's appointment  Possible NSAID allergy- will confirm whether or not she was taking this at the time at the next visit  Eczema - under fair control with hydrocortisone  Plan/Recommendations:   1. Chronic urticaria - Your history does not have any "red flags" such as fevers, joint pains, or permanent skin changes that would be concerning for a more serious cause of hives.  - We will get some labs to rule out serious causes of hives: complete blood count, tryptase level, chronic urticaria panel, CMP, ESR, and CRP.  - Likely this was a nothing burger that won't be a problem again in the future.  - Chronic hives are often times a self limited process and will "burn themselves out" over 6-12 months, although this is not always the case.  - In the meantime, start suppressive dosing of antihistamines at the FIRST SIGN of problems:   - Morning: Xyzal (levocetirizine) 5-10mg  (one to two tablets)  - Evening: Xyzal (levocetirizine) 5-10mg  (one to two tablets)  - If the above is not working, try adding: Pepcid (famotidine) 20mg  - You can change this dosing at home, decreasing the dose as needed or increasing the dosing as needed.   2. Eczema - Continue with the topical steroid as needed, but we will give you a stronger one called triamcinolone.  - DO NOT use this on the face.   3. Return in about 6 months (around 05/31/2023). You can have the follow up appointment with Dr. Dellis Anes or a Nurse Practicioner (our Nurse Practitioners are excellent and always have Physician oversight!).   This note in its entirety was forwarded to the Provider who requested this consultation.  Subjective:   Sharan Siragusa is a 52 y.o. female presenting today for evaluation of  Chief Complaint  Patient presents with   Urticaria    Started in October - all  over the body - arms, hands, back, chest, thighs, and face. Clear up in November - took zyrtec, prednisone tapered twice      Raiford Noble has a history of the following: Patient Active Problem List   Diagnosis Date Noted   Abdominal discomfort, epigastric 02/03/2021   Chronic GERD    Endometriosis 02/14/2018   High blood pressure    Migraines     History obtained from: chart review and patient.  Discussed the use of AI scribe software for clinical note transcription with the patient and/or guardian, who gave verbal consent to proceed.  Trasha Steinhilber was referred by Donita Brooks, MD.     Alvin is a 52 y.o. female presenting for an evaluation of urticaria .   Kajal presents with a recent episode of hives that began in early September and persisted until the beginning of the current month. This was the first occurrence of such a condition. The hives were associated with significant facial and head swelling, but no other systemic symptoms were reported. The patient denied any known triggers, including food or environmental factors. An allergy test conducted by the primary physician (full food panel) only revealed a mild reaction to sesame seeds, which the patient had not consumed.  IgE level was only 0.12.  Everything else was completely negative.  During the episode, the patient was treated with prednisone and Zyrtec, with the former appearing to provide the most relief. The patient also reported a history  of eczema, primarily affecting the hands, which is managed with over-the-counter cortisone cream. The patient requested a stronger prescription for flare-ups.  The patient also reported discoloration of the skin during the hives episode, which has since been improving with the use of cocoa butter. No other significant medical or family history was reported.   The patient is currently employed in childcare. She works at [employer]. Her husband was in the [employer].  He is  now working for the post office.  She has 2 children who are in the [employer] as well.  A third child is not in the [employer].   Otherwise, there is no history of other atopic diseases, including asthma, drug allergies, environmental allergies, stinging insect allergies, or contact dermatitis. There is no significant infectious history. Vaccinations are up to date.    Past Medical History: Patient Active Problem List   Diagnosis Date Noted   Abdominal discomfort, epigastric 02/03/2021   Chronic GERD    Endometriosis 02/14/2018   High blood pressure    Migraines     Medication List:  Allergies as of 12/01/2022   No Known Allergies      Medication List        Accurate as of December 01, 2022  9:40 AM. If you have any questions, ask your nurse or doctor.          celecoxib 200 MG capsule Commonly known as: CeleBREX Take 1 capsule (200 mg total) by mouth daily.   cetirizine 10 MG tablet Commonly known as: ZYRTEC Take 1 tablet (10 mg total) by mouth 2 (two) times daily.   levocetirizine 5 MG tablet Commonly known as: XYZAL Take 1 tablet (5 mg total) by mouth 2 (two) times daily as needed for allergies. Started by: Alfonse Spruce   meloxicam 15 MG tablet Commonly known as: MOBIC Take 15 mg by mouth daily.   Mirena (52 MG) 20 MCG/DAY Iud Generic drug: levonorgestrel Mirena 20 mcg/24 hours (8 yrs) 52 mg intrauterine device  Take 1 device every day by intrauterine route.   omeprazole 40 MG capsule Commonly known as: PRILOSEC TAKE 1 CAPSULE(40 MG) BY MOUTH DAILY BEFORE BREAKFAST   oxybutynin 10 MG 24 hr tablet Commonly known as: DITROPAN-XL TAKE 1 TABLET(10 MG) BY MOUTH AT BEDTIME   predniSONE 20 MG tablet Commonly known as: DELTASONE 3 tabs poqday 1-2, 2 tabs poqday 3-4, 1 tab poqday 5-6   predniSONE 20 MG tablet Commonly known as: DELTASONE 3 tabs poqday 1-2, 2 tabs poqday 3-4, 1 tab poqday 5-6   triamcinolone ointment 0.1 % Commonly known as:  KENALOG Apply 1 Application topically 2 (two) times daily. Started by: Alfonse Spruce        Birth History: non-contributory  Developmental History: non-contributory  Past Surgical History: Past Surgical History:  Procedure Laterality Date   CESAREAN SECTION     COLONOSCOPY N/A 12/12/2020   Procedure: COLONOSCOPY;  Surgeon: Toney Reil, MD;  Location: [facility] ENDOSCOPY;  Service: Gastroenterology;  Laterality: N/A;   ESOPHAGOGASTRODUODENOSCOPY (EGD) WITH PROPOFOL N/A 12/12/2020   Procedure: ESOPHAGOGASTRODUODENOSCOPY (EGD) WITH PROPOFOL;  Surgeon: Toney Reil, MD;  Location: [facility] ENDOSCOPY;  Service: Gastroenterology;  Laterality: N/A;   TUBAL LIGATION       Family History: Family History  Problem Relation Age of Onset   Allergic rhinitis Mother    Asthma Brother    Breast cancer Cousin      Social History: Mayme lives at home with her family.  They live in  a house that is 50 years old.  There is carpeting throughout the home.  They have central heating and cooling.  There is 1 Maltese dog inside of the home.  There are no dust mite covers on the bedding.  There is no tobacco exposure.  She does have a HEPA filter in her home.   Review of systems otherwise negative other than that mentioned in the HPI.    Objective:   Blood pressure (!) 144/86, pulse (!) 106, temperature 98.2 F (36.8 C), resp. rate 18, height 5' 4.17" (1.63 m), weight 148 lb 6.4 oz (67.3 kg), SpO2 99%. Body mass index is 25.34 kg/m.     Physical Exam Vitals reviewed.  Constitutional:      Appearance: She is well-developed.     Comments: Pleasant.  Cooperative for the exam.  HENT:     Head: Normocephalic and atraumatic.     Right Ear: Tympanic membrane, ear canal and external ear normal. No drainage, swelling or tenderness. Tympanic membrane is not injected, scarred, erythematous, retracted or bulging.     Left Ear: Tympanic membrane, ear canal and external ear normal. No  drainage, swelling or tenderness. Tympanic membrane is not injected, scarred, erythematous, retracted or bulging.     Nose: No nasal deformity, septal deviation, mucosal edema or rhinorrhea.     Right Turbinates: Swollen and pale.     Left Turbinates: Swollen and pale.     Right Sinus: No maxillary sinus tenderness or frontal sinus tenderness.     Left Sinus: No maxillary sinus tenderness or frontal sinus tenderness.     Mouth/Throat:     Mouth: Mucous membranes are not pale and not dry.     Pharynx: Uvula midline.  Eyes:     General:        Right eye: No discharge.        Left eye: No discharge.     Conjunctiva/sclera: Conjunctivae normal.     Right eye: Right conjunctiva is not injected. No chemosis.    Left eye: Left conjunctiva is not injected. No chemosis.    Pupils: Pupils are equal, round, and reactive to light.  Cardiovascular:     Rate and Rhythm: Normal rate and regular rhythm.     Heart sounds: Normal heart sounds.  Pulmonary:     Effort: Pulmonary effort is normal. No tachypnea, accessory muscle usage or respiratory distress.     Breath sounds: Normal breath sounds. No wheezing, rhonchi or rales.  Chest:     Chest wall: No tenderness.  Abdominal:     Tenderness: There is no abdominal tenderness. There is no guarding or rebound.  Lymphadenopathy:     Head:     Right side of head: No submandibular, tonsillar or occipital adenopathy.     Left side of head: No submandibular, tonsillar or occipital adenopathy.     Cervical: No cervical adenopathy.  Skin:    General: Skin is warm.     Capillary Refill: Capillary refill takes less than 2 seconds.     Coloration: Skin is not pale.     Findings: No abrasion, erythema, petechiae or rash. Rash is not papular, urticarial or vesicular.     Comments: She does have some hyperpigmented lesions on her bilateral arms.  There are some excoriations present well.  No urticaria present.  Neurological:     Mental Status: She is alert.   Psychiatric:        Behavior: Behavior is cooperative.  Diagnostic studies: labs sent instead          Malachi Bonds, MD [facility]

## 2022-12-08 LAB — ALLERGENS W/COMP RFLX AREA 2
Alternaria Alternata IgE: <0.1 kU/L
Aspergillus Fumigatus IgE: <0.1 kU/L
Bermuda Grass IgE: <0.1 kU/L
Cedar, Mountain IgE: <0.1 kU/L
Cladosporium Herbarum IgE: <0.1 kU/L
Cockroach, German IgE: <0.1 kU/L
Common Silver Birch IgE: <0.1 kU/L
Cottonwood IgE: <0.1 kU/L
D Farinae IgE: <0.1 kU/L
D Pteronyssinus IgE: <0.1 kU/L
E001-IgE Cat Dander: <0.1 kU/L
E005-IgE Dog Dander: <0.1 kU/L
Elm, American IgE: <0.1 kU/L
IgE (Immunoglobulin E), Serum: 10 [IU]/mL (ref 6–495)
Johnson Grass IgE: <0.1 kU/L
Maple/Box Elder IgE: <0.1 kU/L
Mouse Urine IgE: <0.1 kU/L
Oak, White IgE: <0.1 kU/L
Pecan, Hickory IgE: <0.1 kU/L
Penicillium Chrysogen IgE: <0.1 kU/L
Pigweed, Rough IgE: <0.1 kU/L
Ragweed, Short IgE: <0.1 kU/L
Sheep Sorrel IgE Qn: <0.1 kU/L
Timothy Grass IgE: <0.1 kU/L
White Mulberry IgE: <0.1 kU/L

## 2022-12-08 LAB — CMP14+EGFR
ALT: 24 [IU]/L (ref 0–32)
AST: 22 [IU]/L (ref 0–40)
Albumin: 4.7 g/dL (ref 3.9–4.9)
Alkaline Phosphatase: 56 [IU]/L (ref 44–121)
BUN/Creatinine Ratio: 12 (ref 9–23)
BUN: 9 mg/dL (ref 6–24)
Bilirubin Total: 0.4 mg/dL (ref 0.0–1.2)
CO2: 22 mmol/L (ref 20–29)
Calcium: 10.1 mg/dL (ref 8.7–10.2)
Chloride: 102 mmol/L (ref 96–106)
Creatinine, Ser: 0.73 mg/dL (ref 0.57–1.00)
Globulin, Total: 2.7 g/dL (ref 1.5–4.5)
Glucose: 89 mg/dL (ref 70–99)
Potassium: 4.3 mmol/L (ref 3.5–5.2)
Sodium: 139 mmol/L (ref 134–144)
Total Protein: 7.4 g/dL (ref 6.0–8.5)
eGFR: 101 mL/min/{1.73_m2} (ref >59)

## 2022-12-08 LAB — SEDIMENTATION RATE: Sed Rate: 15 mm/h (ref 0–32)

## 2022-12-08 LAB — TRYPTASE: Tryptase: 5.6 ug/L (ref 2.2–13.2)

## 2022-12-08 LAB — C-REACTIVE PROTEIN: CRP: 3 mg/L (ref 0–10)

## 2022-12-08 LAB — THYROID ANTIBODIES
Thyroglobulin Antibody: <1 [IU]/mL (ref 0.0–0.9)
Thyroperoxidase Ab SerPl-aCnc: <9 [IU]/mL (ref 0–34)

## 2022-12-08 LAB — CHRONIC URTICARIA: cu index: 7 (ref <10)

## 2022-12-08 LAB — ANTINUCLEAR ANTIBODIES, IFA: ANA Titer 1: NEGATIVE

## 2022-12-09 ENCOUNTER — Encounter: Payer: Self-pay | Admitting: Sports Medicine

## 2022-12-09 ENCOUNTER — Ambulatory Visit: Admitting: Sports Medicine

## 2022-12-09 DIAGNOSIS — M75102 Unspecified rotator cuff tear or rupture of left shoulder, not specified as traumatic: Secondary | ICD-10-CM | POA: Diagnosis not present

## 2022-12-09 DIAGNOSIS — M25512 Pain in left shoulder: Secondary | ICD-10-CM

## 2022-12-09 DIAGNOSIS — M12812 Other specific arthropathies, not elsewhere classified, left shoulder: Secondary | ICD-10-CM | POA: Diagnosis not present

## 2022-12-09 DIAGNOSIS — G8929 Other chronic pain: Secondary | ICD-10-CM

## 2022-12-09 MED ORDER — LIDOCAINE HCL 1 % IJ SOLN
2.0000 mL | INTRAMUSCULAR | Status: AC | PRN
Start: 2022-12-09 — End: 2022-12-09
  Administered 2022-12-09: 2 mL

## 2022-12-09 MED ORDER — METHYLPREDNISOLONE ACETATE 40 MG/ML IJ SUSP
40.0000 mg | INTRAMUSCULAR | Status: AC | PRN
Start: 2022-12-09 — End: 2022-12-09
  Administered 2022-12-09: 40 mg via INTRA_ARTICULAR

## 2022-12-09 MED ORDER — MELOXICAM 15 MG PO TABS: 15.0000 mg | ORAL_TABLET | Freq: Every day | ORAL | 0 refills | Status: DC

## 2022-12-09 MED ORDER — BUPIVACAINE HCL 0.25 % IJ SOLN
2.0000 mL | INTRAMUSCULAR | Status: AC | PRN
Start: 2022-12-09 — End: 2022-12-09
  Administered 2022-12-09: 2 mL via INTRA_ARTICULAR

## 2022-12-09 NOTE — Progress Notes (Signed)
Patient says that her left shoulder pain flared back up about a month ago and now feels worse than it did previously. She says that she had stopped doing her home exercises, but when the pain returned she did start doing them again. She says she has tried Ibuprofen and Meloxicam and is not getting any relief. She also mentions that her pain has moved up the clavicle as well.

## 2022-12-09 NOTE — Progress Notes (Signed)
Tammy Melton - 52 y.o. female MRN [id number]  Date of birth: 10-13-1970  Office Visit Note: Visit Date: 12/09/2022 PCP: Donita Afton Lavalle, MD Referred by: Donita Chrishelle Zito, MD  Subjective: Chief Complaint  Patient presents with   Left Shoulder - Pain   HPI: Tammy Melton is a pleasant 52 y.o. female who presents today for acute on chronic left shoulder pain.  Has known rotator cuff arthropathy. Received excellent relief in her pain with SAJ injection in July and with PT/HEP that she was very consistent with.  She has been doing well, but unfortunately about 1 month ago her pain flared back up.  Believe this is when she was lifting a heavier child.  Denies any pop, or specific inciting event.  Her pain has continued to worsen that it is now disrupting her sleep, causing migraine headaches and making reaching motions difficult.  She has been alternating ibuprofen and meloxicam without much relief.  She has tried performing her home exercises and has been, but has to stop a certain point secondary to her pain.  Pertinent ROS were reviewed with the patient and found to be negative unless otherwise specified above in HPI.   Assessment & Plan: Visit Diagnoses:  1. Chronic left shoulder pain   2. Tear of left supraspinatus tendon   3. Rotator cuff arthropathy of left shoulder    Plan: Impression is exacerbation of chronic left shoulder pain with known partial-thickness supraspinatus tear and rotator cuff arthropathy.  She had been doing very well from prior injection back in July as well as her home therapy, but a month ago had a flare up of her shoulder pain that is limiting her activity. Given this, we did proceed with a repeat subacromial joint injection. She may take meloxicam once daily to help with inflammation and pain.  Advised on modified rest and activity and will hold from HEP for the remainder of this week, but will restart this starting Monday once daily to every other day  consistently.  She will follow-up with me in about 6 weeks if she is not doing much improved, otherwise as needed.  Follow-up: Return in about 6 weeks (around 01/20/2023), or if symptoms worsen or fail to improve.   Meds & Orders: No orders of the defined types were placed in this encounter.   Orders Placed This Encounter  Procedures   Large Joint Inj     Procedures: Large Joint Inj: L subacromial bursa on 12/09/2022 8:32 AM Indications: pain Details: 22 G 1.5 in needle, posterior approach Medications: 2 mL lidocaine 1 %; 2 mL bupivacaine 0.25 %; 40 mg methylPREDNISolone acetate 40 MG/ML Outcome: tolerated well, no immediate complications  Subacromial Joint Injection, Left Shoulder After discussion on risks/benefits/indications, informed verbal consent was obtained. A timeout was then performed. Patient was seated on table in exam room. The patient's shoulder was prepped with betadine and alcohol swabs and utilizing posterior approach a 22G, 1.5" needle was directed anteriorly and laterally into the patient's subacromial space was injected with 2:2:1 mixture of lidocaine:bupivicaine:depomedrol with appreciation of free-flowing of the injectate into the bursal space. Patient tolerated the procedure well without immediate complications.   Procedure, treatment alternatives, risks and benefits explained, specific risks discussed. Consent was given by the patient. Immediately prior to procedure a time out was called to verify the correct patient, procedure, equipment, support staff and site/side marked as required. Patient was prepped and draped in the usual sterile fashion.  Clinical History: No specialty comments available.  She reports that she has never smoked. She has never used smokeless tobacco. No results for input(s): "HGBA1C", "LABURIC" in the last 8760 hours.  Objective:    Physical Exam  Gen: Well-appearing, in no acute distress; non-toxic CV:  Well-perfused. Warm.   Resp: Breathing unlabored on room air; no wheezing. Psych: Fluid speech in conversation; appropriate affect; normal thought process Neuro: Sensation intact throughout. No gross coordination deficits.   Ortho Exam - Left shoulder: + TTP at Codman's point.  There is full active and passive range of motion although pain with endrange flexion as well as abduction.  Positive drop arm test, positive empty can, positive pain with resisted ER.  There is weakness secondary to pain.  Negative Hawkins impingement testing.  Negative Gerber liftoff.  Imaging:  Narrative & Impression  CLINICAL DATA:  Left shoulder pain with abduction.   EXAM: MRI OF THE LEFT SHOULDER WITHOUT CONTRAST   TECHNIQUE: Multiplanar, multisequence MR imaging of the shoulder was performed. No intravenous contrast was administered.   COMPARISON:  None Available.   FINDINGS: Rotator cuff: Mild tendinosis of the supraspinatus tendon with a small partial-thickness bursal surface tear anteriorly. Mild tendinosis of the infraspinatus tendon. Teres minor tendon is intact. Subscapularis tendon is intact.   Muscles: No muscle atrophy or edema. No intramuscular fluid collection or hematoma.   Biceps Long Head: Intraarticular and extraarticular portions of the biceps tendon are intact.   Acromioclavicular Joint: Mild arthropathy of the acromioclavicular joint. Trace subacromial/subdeltoid bursal fluid.   Glenohumeral Joint: No joint effusion. No chondral defect.   Labrum: Grossly intact, but evaluation is limited by lack of intraarticular fluid/contrast.   Bones: No fracture or dislocation. No aggressive osseous lesion.   Other: No fluid collection or hematoma.   IMPRESSION: 1. Mild tendinosis of the supraspinatus tendon with a small partial-thickness bursal surface tear anteriorly. 2. Mild tendinosis of the infraspinatus tendon.     Electronically Signed   By: Elige Ko M.D.   On: 05/15/2022 05:48    Past  Medical/Family/Surgical/Social History: Medications & Allergies reviewed per EMR, new medications updated. Patient Active Problem List   Diagnosis Date Noted   Abdominal discomfort, epigastric 02/03/2021   Chronic GERD    Endometriosis 02/14/2018   High blood pressure    Migraines    Past Medical History:  Diagnosis Date   Anemia    Angio-edema    Depression    Eczema    Gastric erythema    GERD (gastroesophageal reflux disease)    High blood pressure    h/o   Migraines    Urticaria    Family History  Problem Relation Age of Onset   Allergic rhinitis Mother    Asthma Brother    Breast cancer Cousin    Past Surgical History:  Procedure Laterality Date   CESAREAN SECTION     COLONOSCOPY N/A 12/12/2020   Procedure: COLONOSCOPY;  Surgeon: Toney Reil, MD;  Location: [facility] ENDOSCOPY;  Service: Gastroenterology;  Laterality: N/A;   ESOPHAGOGASTRODUODENOSCOPY (EGD) WITH PROPOFOL N/A 12/12/2020   Procedure: ESOPHAGOGASTRODUODENOSCOPY (EGD) WITH PROPOFOL;  Surgeon: Toney Reil, MD;  Location: [facility] ENDOSCOPY;  Service: Gastroenterology;  Laterality: N/A;   TUBAL LIGATION     Social History   Occupational History   Not on file  Tobacco Use   Smoking status: Never   Smokeless tobacco: Never  Vaping Use   Vaping status: Never Used  Substance and Sexual Activity   Alcohol use:  No   Drug use: No   Sexual activity: Not on file

## 2023-01-06 ENCOUNTER — Other Ambulatory Visit: Payer: Self-pay | Admitting: Sports Medicine

## 2023-01-13 ENCOUNTER — Other Ambulatory Visit: Payer: Self-pay | Admitting: Family Medicine

## 2023-01-13 NOTE — Telephone Encounter (Signed)
 Requested Prescriptions  Pending Prescriptions Disp Refills   oxybutynin  (DITROPAN -XL) 10 MG 24 hr tablet [Pharmacy Med Name: OXYBUTYNIN  ER 10MG  TABLETS] 90 tablet 1    Sig: TAKE 1 TABLET(10 MG) BY MOUTH AT BEDTIME     Urology:  Bladder Agents Failed - 01/13/2023  8:25 AM      Failed - Valid encounter within last 12 months    Recent Outpatient Visits           2 years ago General medical exam   [facility] Duanne Butler DASEN, MD   2 years ago Gastroesophageal reflux disease, unspecified whether esophagitis present   [facility] Duanne Butler DASEN, MD   2 years ago Cervical radiculopathy   [facility] Duanne Butler DASEN, MD   3 years ago Acute pain of left knee   [facility] Duanne Butler DASEN, MD   4 years ago Iron deficiency anemia, unspecified iron deficiency anemia type   [facility] [place], Theodoro FALCON, MD       Future Appointments             In 4 months Iva, Marty Saltness, MD [facility] at [facility]

## 2023-02-05 ENCOUNTER — Other Ambulatory Visit: Payer: Self-pay | Admitting: Sports Medicine

## 2023-02-26 ENCOUNTER — Other Ambulatory Visit: Payer: Self-pay | Admitting: Allergy & Immunology

## 2023-03-24 ENCOUNTER — Ambulatory Visit (INDEPENDENT_AMBULATORY_CARE_PROVIDER_SITE_OTHER): Admitting: Sports Medicine

## 2023-03-24 ENCOUNTER — Other Ambulatory Visit: Payer: Self-pay | Admitting: Sports Medicine

## 2023-03-24 ENCOUNTER — Encounter: Payer: Self-pay | Admitting: Sports Medicine

## 2023-03-24 ENCOUNTER — Other Ambulatory Visit: Payer: Self-pay

## 2023-03-24 ENCOUNTER — Other Ambulatory Visit (INDEPENDENT_AMBULATORY_CARE_PROVIDER_SITE_OTHER)

## 2023-03-24 DIAGNOSIS — M25512 Pain in left shoulder: Secondary | ICD-10-CM

## 2023-03-24 DIAGNOSIS — M25511 Pain in right shoulder: Secondary | ICD-10-CM

## 2023-03-24 DIAGNOSIS — G8929 Other chronic pain: Secondary | ICD-10-CM

## 2023-03-24 DIAGNOSIS — M12812 Other specific arthropathies, not elsewhere classified, left shoulder: Secondary | ICD-10-CM

## 2023-03-24 DIAGNOSIS — M75102 Unspecified rotator cuff tear or rupture of left shoulder, not specified as traumatic: Secondary | ICD-10-CM | POA: Diagnosis not present

## 2023-03-24 MED ORDER — LIDOCAINE HCL 1 % IJ SOLN
2.0000 mL | INTRAMUSCULAR | Status: AC | PRN
Start: 2023-03-24 — End: 2023-03-24
  Administered 2023-03-24: 2 mL

## 2023-03-24 MED ORDER — BUPIVACAINE HCL 0.25 % IJ SOLN
2.0000 mL | INTRAMUSCULAR | Status: AC | PRN
  Administered 2023-03-24: 2 mL via INTRA_ARTICULAR

## 2023-03-24 MED ORDER — METHYLPREDNISOLONE ACETATE 40 MG/ML IJ SUSP
80.0000 mg | INTRAMUSCULAR | Status: AC | PRN
Start: 2023-03-24 — End: 2023-03-24
  Administered 2023-03-24: 80 mg via INTRA_ARTICULAR

## 2023-03-24 NOTE — Progress Notes (Signed)
 Tammy Melton - 53 y.o. female MRN [id number]  Date of birth: 07/20/1970  Office Visit Note: Visit Date: 03/24/2023 PCP: Donita Heron Pitcock, MD Referred by: Donita Jereld Presti, MD  Subjective: Chief Complaint  Patient presents with   Right Shoulder - Pain   Left Shoulder - Pain   HPI: Tammy Melton is a pleasant 53 y.o. female who presents today for chronic left shoulder pain and new evaluation of right shoulder pain.  I have seen her in the past for left shoulder pain she does have tendinosis of the supraspinatus and infraspinatus tendon with a small partial-thickness bursal tear of the supraspinatus, but no full-thickness tearing or tendon retraction based on her MRI.  Back in July of last year we put on the subacromial joint injection which gave her quite good relief, she had been doing her home exercise therapy as well.  We did repeat subacromial joint injection in December which unfortunately did not give her much relief this time.  She also has been having pain in the right shoulder for the last month or so.  She does have meloxicam but this is not quite as helpful, she is using ibuprofen or Aleve at times without significant relief.  Pertinent ROS were reviewed with the patient and found to be negative unless otherwise specified above in HPI.   Assessment & Plan: Visit Diagnoses:  1. Chronic left shoulder pain   2. Tear of left supraspinatus tendon   3. Rotator cuff arthropathy of left shoulder   4. Chronic right shoulder pain    Plan: Impression is acute on chronic left shoulder pain in the setting of rotator cuff tendinosis with a small partial-thickness bursal sided tear.  She did receive great relief from her first subacromial joint injection, but the last 1 back in December did not give her great relief.  We discussed trying an ultrasound-guided left shoulder joint injection versus sending her to my surgical partner, Dr. August Saucer, for a discussion on rotator cuff repair.  She  wants to have surgical management as a last resort, which I am in agreement with.  Through shared decision-making, we did proceed with ultrasound-guided left shoulder glenohumeral joint injection.  Advised on 48-72 hours of modified rest/activity.  I would like her to resume her rotator cuff and shoulder rehab exercises at home once daily starting this upcoming Sunday/Monday.  She will do this for both the left and the right shoulder.  I did discuss that her right shoulder has no osseous abnormality and her rotator cuff testing on exam is strong, I believe this is more compensatory pain as she has been offloading her left shoulder given discomfort.  She will let me know in about 10 days how she is doing.  Next step may include formalized physical therapy for both of the shoulders if not improving with the above injection and her HEP.  She may discontinue meloxicam 15 mg as she was not finding this very helpful.  She may use Aleve or ibuprofen or ice for pain as needed.  Follow-up: Return if symptoms worsen or fail to improve, for will send me mychart message update in about 10 days.   Meds & Orders: No orders of the defined types were placed in this encounter.   Orders Placed This Encounter  Procedures   Large Joint Inj   XR Shoulder Right   XR Shoulder Left   US Guided Needle Placement - No Linked Charges     Procedures: Large Joint Inj: L glenohumeral  on 03/24/2023 9:18 AM Indications: pain Details: 22 G 3.5 in needle, ultrasound-guided posterior approach Medications: 2 mL lidocaine 1 %; 2 mL bupivacaine 0.25 %; 80 mg methylPREDNISolone acetate 40 MG/ML Outcome: tolerated well, no immediate complications  US-guided glenohumeral joint injection, left shoulder After discussion on risks/benefits/indications, informed verbal consent was obtained. A timeout was then performed. The patient was positioned lying lateral recumbent on examination table. The patient's shoulder was prepped with betadine  and multiple alcohol swabs and utilizing ultrasound guidance, the patient's glenohumeral joint was identified on ultrasound. Using ultrasound guidance a 22-gauge, 3.5 inch needle with a mixture of 2:2:2 cc's lidocaine:bupivicaine:depomedrol was directed from a lateral to medial direction via in-plane technique into the glenohumeral joint with visualization of appropriate spread of injectate into the joint. Patient tolerated the procedure well without immediate complications.      Procedure, treatment alternatives, risks and benefits explained, specific risks discussed. Consent was given by the patient. Immediately prior to procedure a time out was called to verify the correct patient, procedure, equipment, support staff and site/side marked as required. Patient was prepped and draped in the usual sterile fashion.          Clinical History: No specialty comments available.  She reports that she has never smoked. She has never used smokeless tobacco. No results for input(s): "HGBA1C", "LABURIC" in the last 8760 hours.  Objective:    Physical Exam  Gen: Well-appearing, in no acute distress; non-toxic CV: Well-perfused. Warm.  Resp: Breathing unlabored on room air; no wheezing. Psych: Fluid speech in conversation; appropriate affect; normal thought process  Ortho Exam - Bilateral shoulders: No AC joint or specific bony TTP.  There is full active and passive range of motion although pain with the left shoulder with endrange flexion and abduction.  Negative drop arm bilaterally.  The left shoulder has pain and associated weakness with empty can as well as 4/5 strength with resisted external rotation that causes associated pain.  There is full strength of right shoulder rotator cuff testing.  Imaging: XR Shoulder Left Result Date: 03/24/2023 3 views of the left shoulder including AP, Grashey, and Xu were ordered and reviewed by myself.  X-rays demonstrate mild glenohumeral joint arthritis with a  small spur off the inferior acetabulum and inferior humeral head.  Minimal to mild AC joint arthritic humeral head is well located without superior migration.  Otherwise no acute osseous abnormality noted.  XR Shoulder Right Result Date: 03/24/2023 3 views of the right shoulder including AP, Grashey, axial view were ordered and self.  X-rays show a well-seated humeral head with no significant arthritic change.  There is mild to moderate AC joint arthritic change.  Otherwise no acute osseous abnormality.  *MRI reviewed today by myself  Narrative & Impression  CLINICAL DATA:  Left shoulder pain with abduction.   EXAM: MRI OF THE LEFT SHOULDER WITHOUT CONTRAST   TECHNIQUE: Multiplanar, multisequence MR imaging of the shoulder was performed. No intravenous contrast was administered.   COMPARISON:  None Available.   FINDINGS: Rotator cuff: Mild tendinosis of the supraspinatus tendon with a small partial-thickness bursal surface tear anteriorly. Mild tendinosis of the infraspinatus tendon. Teres minor tendon is intact. Subscapularis tendon is intact.   Muscles: No muscle atrophy or edema. No intramuscular fluid collection or hematoma.   Biceps Long Head: Intraarticular and extraarticular portions of the biceps tendon are intact.   Acromioclavicular Joint: Mild arthropathy of the acromioclavicular joint. Trace subacromial/subdeltoid bursal fluid.   Glenohumeral Joint: No joint effusion.  No chondral defect.   Labrum: Grossly intact, but evaluation is limited by lack of intraarticular fluid/contrast.   Bones: No fracture or dislocation. No aggressive osseous lesion.   Other: No fluid collection or hematoma.   IMPRESSION: 1. Mild tendinosis of the supraspinatus tendon with a small partial-thickness bursal surface tear anteriorly. 2. Mild tendinosis of the infraspinatus tendon.     Electronically Signed   By: Elige Ko M.D.   On: 05/15/2022 05:48    Past  Medical/Family/Surgical/Social History: Medications & Allergies reviewed per EMR, new medications updated. Patient Active Problem List   Diagnosis Date Noted   Abdominal discomfort, epigastric 02/03/2021   Chronic GERD    Endometriosis 02/14/2018   High blood pressure    Migraines    Past Medical History:  Diagnosis Date   Anemia    Angio-edema    Depression    Eczema    Gastric erythema    GERD (gastroesophageal reflux disease)    High blood pressure    h/o   Migraines    Urticaria    Family History  Problem Relation Age of Onset   Allergic rhinitis Mother    Asthma Brother    Breast cancer Cousin    Past Surgical History:  Procedure Laterality Date   CESAREAN SECTION     COLONOSCOPY N/A 12/12/2020   Procedure: COLONOSCOPY;  Surgeon: Toney Reil, MD;  Location: [facility] ENDOSCOPY;  Service: Gastroenterology;  Laterality: N/A;   ESOPHAGOGASTRODUODENOSCOPY (EGD) WITH PROPOFOL N/A 12/12/2020   Procedure: ESOPHAGOGASTRODUODENOSCOPY (EGD) WITH PROPOFOL;  Surgeon: Toney Reil, MD;  Location: [facility] ENDOSCOPY;  Service: Gastroenterology;  Laterality: N/A;   TUBAL LIGATION     Social History   Occupational History   Not on file  Tobacco Use   Smoking status: Never   Smokeless tobacco: Never  Vaping Use   Vaping status: Never Used  Substance and Sexual Activity   Alcohol use: No   Drug use: No   Sexual activity: Not on file

## 2023-03-24 NOTE — Addendum Note (Signed)
 Addended by: Jamie Kato III on: 03/24/2023 11:20 AM   Modules accepted: Orders

## 2023-03-24 NOTE — Progress Notes (Signed)
 Patient says that after her last injection for her left shoulder she did not get any relief. She says that in the time since then it seems to have gotten worse, in that her pain seems more intense. She says the pain is still in the top of her shoulder. For about 1 month now, patient has been experiencing pain in the right shoulder. She says that this pain is also in the top of the shoulder, but states that it feels different than the left has. Her pain is worse at night. Patient says that she stopped taking the Meloxicam as it didn't seem to help much; she says that Ibuprofen and Aleve seem to help more, but these still don't give her much relief at night.

## 2023-06-01 ENCOUNTER — Ambulatory Visit: Admitting: Allergy & Immunology

## 2023-07-11 ENCOUNTER — Other Ambulatory Visit: Payer: Self-pay | Admitting: Family Medicine

## 2023-09-17 ENCOUNTER — Other Ambulatory Visit: Payer: Self-pay | Admitting: Obstetrics and Gynecology

## 2023-09-17 DIAGNOSIS — Z1231 Encounter for screening mammogram for malignant neoplasm of breast: Secondary | ICD-10-CM

## 2023-11-08 ENCOUNTER — Encounter: Payer: Self-pay | Admitting: Radiology

## 2023-11-11 ENCOUNTER — Other Ambulatory Visit (INDEPENDENT_AMBULATORY_CARE_PROVIDER_SITE_OTHER)

## 2023-11-11 ENCOUNTER — Encounter: Payer: Self-pay | Admitting: Sports Medicine

## 2023-11-11 ENCOUNTER — Ambulatory Visit
Admission: RE | Admit: 2023-11-11 | Discharge: 2023-11-11 | Disposition: A | Discharge status: Home / Self Care | Source: Ambulatory Visit | Attending: Obstetrics and Gynecology | Admitting: Obstetrics and Gynecology

## 2023-11-11 ENCOUNTER — Ambulatory Visit: Admitting: Sports Medicine

## 2023-11-11 DIAGNOSIS — M25512 Pain in left shoulder: Secondary | ICD-10-CM | POA: Diagnosis not present

## 2023-11-11 DIAGNOSIS — M12812 Other specific arthropathies, not elsewhere classified, left shoulder: Secondary | ICD-10-CM

## 2023-11-11 DIAGNOSIS — M25562 Pain in left knee: Secondary | ICD-10-CM | POA: Diagnosis not present

## 2023-11-11 DIAGNOSIS — M1712 Unilateral primary osteoarthritis, left knee: Secondary | ICD-10-CM

## 2023-11-11 DIAGNOSIS — G8929 Other chronic pain: Secondary | ICD-10-CM

## 2023-11-11 DIAGNOSIS — M7122 Synovial cyst of popliteal space [Baker], left knee: Secondary | ICD-10-CM | POA: Diagnosis not present

## 2023-11-11 DIAGNOSIS — Z1231 Encounter for screening mammogram for malignant neoplasm of breast: Secondary | ICD-10-CM

## 2023-11-11 MED ORDER — LIDOCAINE HCL 1 % IJ SOLN
2.0000 mL | INTRAMUSCULAR | Status: AC | PRN
  Administered 2023-11-11: 2 mL

## 2023-11-11 MED ORDER — METHYLPREDNISOLONE ACETATE 40 MG/ML IJ SUSP
80.0000 mg | INTRAMUSCULAR | Status: AC | PRN
  Administered 2023-11-11: 80 mg via INTRA_ARTICULAR

## 2023-11-11 MED ORDER — BUPIVACAINE HCL 0.25 % IJ SOLN
2.0000 mL | INTRAMUSCULAR | Status: AC | PRN
  Administered 2023-11-11: 2 mL via INTRA_ARTICULAR

## 2023-11-11 NOTE — Progress Notes (Signed)
 Tammy Melton - 53 y.o. female MRN [id number]  Date of birth: 17-Sep-1970  Office Visit Note: Visit Date: 11/11/2023 PCP: Duanne Butler DASEN, MD Referred by: Duanne Butler DASEN, MD  Subjective: Chief Complaint  Patient presents with   Left Knee - Pain   Left Shoulder - Pain   HPI: Tammy Melton is a pleasant 53 y.o. female who presents today for follow-up of chronic left shoulder pain, newer onset left knee pain x 1 month.  Left shoulder -the shoulder overall is doing better than it has previously.  As reminder, he did have an MRI last year with rotator cuff tendinosis with a very small articular sided supraspinatus tear.  Nothing at that point that warranted surgical intervention.  She initially received 100% relief of her pain for period time after subacromial joint injection but subsequent injection earlier this year only gave her some relief.  She does admit she has not been doing her rehab exercises but still has the sheets for this.  Left knee - just over the last month she has been having medial sided knee pain with associated swelling and clicking/popping.  No specific injury.  She does feel the sensation as the knee may give out on her but has not had any unstable episodes.  She notices swelling on the knee that she feels that restriction of motion and the swelling is quite visible towards the end of the day after she is on her feet.  She has been wearing a knee brace with work.  Years ago she had a similar occurrence and had a corticosteroid injection which relieved her pain.  Lab Results  Component Value Date   CREATININE 0.73 12/01/2022   Pertinent ROS were reviewed with the patient and found to be negative unless otherwise specified above in HPI.   Assessment & Plan: Visit Diagnoses:  1. Chronic pain of left knee   2. Baker's cyst of knee, left   3. Chronic left shoulder pain   4. Rotator cuff arthropathy of left shoulder    Plan: Impression is chronic left shoulder  pain with previous MRI confirmed tendinosis and a small articular sided supraspinatus tear.  She has good strength and preservation but still has provocative maneuvers from the supraspinatus that bother her.  She has not been doing any physical therapy or home exercises but is open to restarting these.  Discussed the importance of getting back into these over the course of the next 6 weeks and performing these at least on an every other day basis, she is agreeable to this.  In terms of her knee pain, she has medial joint sided pain with clicking and catching that does possibly raise my suspicion for meniscal involvement.  Bedside ultrasound shows a small Baker's cyst which likely correlates with intra-articular pathology.  There is mild medial left knee OA but certainly not severe.  We discussed further therapeutic and diagnostic benefit proceeding with a steroid injection for the left knee, performed today patient tolerated well.  Also would like her to start with rehab exercises for knee strengthening and stability.  I did provide a handout and demonstrated these with her today.  She may use ice for any postinjection pain.  Okay to use over-the-counter anti-inflammatories in the short-term.  I will see her back in 6 weeks to reevaluate.  If for some reason her knee pain is still persistent and/or she has ongoing clicking/catching or instability, next step would be consideration of MRI.  Follow-up: Return in about 6  weeks (around 12/23/2023) for L-knee, L-shoulder .   Meds & Orders: No orders of the defined types were placed in this encounter.   Orders Placed This Encounter  Procedures   Large Joint Inj   XR Knee Complete 4 Views Left     Procedures: Large Joint Inj: L knee on 11/11/2023 10:51 AM Indications: pain and diagnostic evaluation Details: 22 G 1.5 in needle, anterolateral approach Medications: 2 mL lidocaine  1 %; 2 mL bupivacaine  0.25 %; 80 mg methylPREDNISolone  acetate 40 MG/ML Outcome:  tolerated well, no immediate complications  Knee Injection, Left: After discussion on risks/benefits/indications, informed verbal consent was obtained and a timeout was performed, patient was seated on exam table. The patient's knee was prepped with Betadine and alcohol swab and utilizing anterolateral approach, the patient's knee was injected intraarticularly with 2:2:2 lidocaine  1%:bupivicaine 0.25%:depomedrol. Patient tolerated the procedure well without immediate complications.  Procedure, treatment alternatives, risks and benefits explained, specific risks discussed. Consent was given by the patient. Patient was prepped and draped in the usual sterile fashion.          Clinical History: No specialty comments available.  She reports that she has never smoked. She has never used smokeless tobacco. No results for input(s): HGBA1C, LABURIC in the last 8760 hours.  Objective:    Physical Exam  Gen: Well-appearing, in no acute distress; non-toxic CV: Well-perfused. Warm.  Resp: Breathing unlabored on room air; no wheezing. Psych: Fluid speech in conversation; appropriate affect; normal thought process  Ortho Exam - Left knee: + TTP over the medial joint line.  There is full range of motion with flexion and extension.  No varus or valgus instability.  There is mild pain with Thessaly's testing and her joint line with flexion.  Negative McMurray's testing today.  Ligamentously intact.  There is a small Baker's cyst palpable in the posterior knee.  - Left shoulder: No redness swelling or effusion.  Full active and passive range of motion.  There is mild pain and associated weakness with resisted abduction.  Positive empty can testing.  There is preserved internal and external strength.  Imaging: XR Knee Complete 4 Views Left Result Date: 11/11/2023 4 view x-ray of the left knee including AP, lateral, sunrise and oblique views were ordered and reviewed by myself today.  X-rays  demonstrate mild medial tibiofemoral joint space narrowing with early arthritic change.  There is no significant arthritis noted.  Neutral patella with marginal superior spur.  There is a calcification noted within the popliteal fossa.  No acute fracture or otherwise acute bony abnormality noted.  *I did perform bedside POCUS ultrasound of the posterior knee which showed a small Baker's cyst.  *I did review her left shoulder MRI as below today.  Narrative & Impression  CLINICAL DATA:  Left shoulder pain with abduction.   EXAM: MRI OF THE LEFT SHOULDER WITHOUT CONTRAST   TECHNIQUE: Multiplanar, multisequence MR imaging of the shoulder was performed. No intravenous contrast was administered.   COMPARISON:  None Available.   FINDINGS: Rotator cuff: Mild tendinosis of the supraspinatus tendon with a small partial-thickness bursal surface tear anteriorly. Mild tendinosis of the infraspinatus tendon. Teres minor tendon is intact. Subscapularis tendon is intact.   Muscles: No muscle atrophy or edema. No intramuscular fluid collection or hematoma.   Biceps Long Head: Intraarticular and extraarticular portions of the biceps tendon are intact.   Acromioclavicular Joint: Mild arthropathy of the acromioclavicular joint. Trace subacromial/subdeltoid bursal fluid.   Glenohumeral Joint: No joint effusion. No  chondral defect.   Labrum: Grossly intact, but evaluation is limited by lack of intraarticular fluid/contrast.   Bones: No fracture or dislocation. No aggressive osseous lesion.   Other: No fluid collection or hematoma.   IMPRESSION: 1. Mild tendinosis of the supraspinatus tendon with a small partial-thickness bursal surface tear anteriorly. 2. Mild tendinosis of the infraspinatus tendon.     Electronically Signed   By: Julaine Blanch M.D.   On: 05/15/2022 05:48    Past Medical/Family/Surgical/Social History: Medications & Allergies reviewed per EMR, new medications  updated. Patient Active Problem List   Diagnosis Date Noted   Abdominal discomfort, epigastric 02/03/2021   Chronic GERD    Endometriosis 02/14/2018   High blood pressure    Migraines    Past Medical History:  Diagnosis Date   Anemia    Angio-edema    Depression    Eczema    Gastric erythema    GERD (gastroesophageal reflux disease)    High blood pressure    h/o   Migraines    Urticaria    Family History  Problem Relation Age of Onset   Allergic rhinitis Mother    Asthma Brother    Breast cancer Cousin    Past Surgical History:  Procedure Laterality Date   CESAREAN SECTION     COLONOSCOPY N/A 12/12/2020   Procedure: COLONOSCOPY;  Surgeon: Unk Corinn Skiff, MD;  Location: [facility];  Service: Gastroenterology;  Laterality: N/A;   ESOPHAGOGASTRODUODENOSCOPY (EGD) WITH PROPOFOL  N/A 12/12/2020   Procedure: ESOPHAGOGASTRODUODENOSCOPY (EGD) WITH PROPOFOL ;  Surgeon: Unk Corinn Skiff, MD;  Location: [facility];  Service: Gastroenterology;  Laterality: N/A;   TUBAL LIGATION     Social History   Occupational History   Not on file  Tobacco Use   Smoking status: Never   Smokeless tobacco: Never  Vaping Use   Vaping status: Never Used  Substance and Sexual Activity   Alcohol use: No   Drug use: No   Sexual activity: Not on file

## 2023-11-11 NOTE — Progress Notes (Signed)
 Patient says that her left shoulder is feeling a bit better since her last visit. She did not get any relief from the injection in March. She says that she has not been doing her home exercises, and has not done anything else to treat the shoulder at home.  Patient says that she has had left knee pain, swelling, and clicking/popping for about a month. She has not had any instances where the knee felt like it went out from under her, although she does feel that it could. She says that her pain and swelling is worse at the end of a workday as she is on her feet. She has not done anything to treat her symptoms aside from wearing a brace.

## 2023-12-23 ENCOUNTER — Ambulatory Visit: Admitting: Sports Medicine

## 2023-12-23 DIAGNOSIS — M1712 Unilateral primary osteoarthritis, left knee: Secondary | ICD-10-CM

## 2023-12-23 DIAGNOSIS — M79671 Pain in right foot: Secondary | ICD-10-CM

## 2023-12-23 DIAGNOSIS — M25562 Pain in left knee: Secondary | ICD-10-CM | POA: Diagnosis not present

## 2023-12-23 DIAGNOSIS — M12812 Other specific arthropathies, not elsewhere classified, left shoulder: Secondary | ICD-10-CM

## 2023-12-23 DIAGNOSIS — M25462 Effusion, left knee: Secondary | ICD-10-CM | POA: Diagnosis not present

## 2023-12-23 NOTE — Progress Notes (Signed)
 Patient says that her shoulder is not a problem at all anymore. She did get some relief from her knee pain with the injection, although not complete and not long-term. She has been doing her home exercises every other day, and says that those are going well for her. She also mentions some pain over the lateral foot. She wears a compression sock which alleviates her pain while it is on, but her pain returns when she takes it off.

## 2023-12-23 NOTE — Progress Notes (Addendum)
 Tammy Melton - 53 y.o. female MRN [id number]  Date of birth: July 10, 1970  Office Visit Note: Visit Date: 12/23/2023 PCP: Duanne Butler DASEN, MD Referred by: Duanne Butler DASEN, MD  Subjective: Chief Complaint  Patient presents with   Left Knee - Follow-up   HPI: Tammy Melton is a pleasant 53 y.o. female who presents today for f/u left shoulder and left knee.  Left shoulder - doing great. Is doing home exercises every other day.  Is really not having any pain in the shoulder at this time.  Left knee - had injection on 11/11/23 - this helped some but certainly did not get rid of her pain.  She continues with clicking and catching in the knee.  Has pain with bending the knee.  She does report swelling which is usually worse at the end of the day.  Recently started back on meloxicam  15 mg once daily just this week. Is doing home exercises every other day since beginning of Nov.  Also having some right foot pain - bothers her when she squeezes it.  If she has been relying on this right leg more.  Swelling or injury.  Pertinent ROS were reviewed with the patient and found to be negative unless otherwise specified above in HPI.   Assessment & Plan: Visit Diagnoses:  1. Unilateral primary osteoarthritis, left knee   2. Pain and swelling of left knee   3. Rotator cuff arthropathy of left shoulder   4. Pain in right foot    Plan: Impression is chronic left knee pain with mild osteoarthritic change of the medial tibiofemoral joint space and patellofemoral joint.  He does have the knee joint and a previous Baker's cyst that does correlate with possible intra-articular pathology.  She has been doing her home rehab exercises consistently and only finding mild benefit.  Given her ongoing pain and associated clicking/catching in the knee, I would like to further evaluate with MRI.  Specifically to look at the meniscus as well as the cartilage underlying the patellofemoral joint.  Given her pain and  swelling, she will resume meloxicam  15 mg for 2-3 weeks scheduled before then tapering to PRN use.  Recommend icing and knee as well as the right lateral foot.  The right foot seems more tendinitis in nature which is likely compensatory from her offloading the left knee.  She will continue her knee compression sleeves for both knees.  I we will see her back 1 week after MRI to review and discuss next steps.  If still having discomfort in the right foot, could consider baseline x-rays at that time.  Follow-up: Return for f/u 1 week after MRI to review and discuss next steps .   Meds & Orders: No orders of the defined types were placed in this encounter.   Orders Placed This Encounter  Procedures   MR Knee Left w/o contrast     Procedures: No procedures performed      Clinical History: No specialty comments available.  She reports that she has never smoked. She has never used smokeless tobacco. No results for input(s): HGBA1C, LABURIC in the last 8760 hours.  Objective:    Physical Exam  Gen: Well-appearing, in no acute distress; non-toxic CV: Well-perfused. Warm.  Resp: Breathing unlabored on room air; no wheezing. Psych: Fluid speech in conversation; appropriate affect; normal thought process  Ortho Exam - Left knee: There is a trace to small effusion on the knee.  Range of motion 0-130 degrees with pain at  endrange flexion.  There is pain with bending and + theater sign.  There is associated discomfort over the medial joint line with McMurray's testing.  - Right foot: Neutral longitudinal arch.  Full range of motion about the ankle.  Negative Mulder's click.  There is mild discomfort with bilateral midfoot squeeze.  Imaging: No results found.  Past Medical/Family/Surgical/Social History: Medications & Allergies reviewed per EMR, new medications updated. Patient Active Problem List   Diagnosis Date Noted   Abdominal discomfort, epigastric 02/03/2021   Chronic GERD     Endometriosis 02/14/2018   High blood pressure    Migraines    Past Medical History:  Diagnosis Date   Anemia    Angio-edema    Depression    Eczema    Gastric erythema    GERD (gastroesophageal reflux disease)    High blood pressure    h/o   Migraines    Urticaria    Family History  Problem Relation Age of Onset   Allergic rhinitis Mother    Asthma Brother    Breast cancer Cousin    Past Surgical History:  Procedure Laterality Date   CESAREAN SECTION     COLONOSCOPY N/A 12/12/2020   Procedure: COLONOSCOPY;  Surgeon: Unk Corinn Skiff, MD;  Location: [facility];  Service: Gastroenterology;  Laterality: N/A;   ESOPHAGOGASTRODUODENOSCOPY (EGD) WITH PROPOFOL  N/A 12/12/2020   Procedure: ESOPHAGOGASTRODUODENOSCOPY (EGD) WITH PROPOFOL ;  Surgeon: Unk Corinn Skiff, MD;  Location: [facility];  Service: Gastroenterology;  Laterality: N/A;   TUBAL LIGATION     Social History   Occupational History   Not on file  Tobacco Use   Smoking status: Never   Smokeless tobacco: Never  Vaping Use   Vaping status: Never Used  Substance and Sexual Activity   Alcohol use: No   Drug use: No   Sexual activity: Not on file

## 2023-12-24 ENCOUNTER — Inpatient Hospital Stay: Admission: RE | Admit: 2023-12-24 | Discharge: 2023-12-24 | Attending: Sports Medicine

## 2023-12-24 DIAGNOSIS — M1712 Unilateral primary osteoarthritis, left knee: Secondary | ICD-10-CM

## 2023-12-24 DIAGNOSIS — M25562 Pain in left knee: Secondary | ICD-10-CM

## 2024-01-10 ENCOUNTER — Encounter: Payer: Self-pay | Admitting: Sports Medicine

## 2024-01-10 ENCOUNTER — Ambulatory Visit: Admitting: Sports Medicine

## 2024-01-10 DIAGNOSIS — M1712 Unilateral primary osteoarthritis, left knee: Secondary | ICD-10-CM | POA: Diagnosis not present

## 2024-01-10 DIAGNOSIS — M25462 Effusion, left knee: Secondary | ICD-10-CM | POA: Diagnosis not present

## 2024-01-10 DIAGNOSIS — M23322 Other meniscus derangements, posterior horn of medial meniscus, left knee: Secondary | ICD-10-CM | POA: Diagnosis not present

## 2024-01-10 DIAGNOSIS — M25562 Pain in left knee: Secondary | ICD-10-CM

## 2024-01-10 NOTE — Progress Notes (Signed)
 Patient says that her knee feels about the same as it felt at her last visit. She does her home exercises consistently and those are going well. She says that squats are most bothersome. Patient is here for MRI review today.

## 2024-01-10 NOTE — Progress Notes (Signed)
 "  Tammy Melton - 54 y.o. female MRN [id number]  Date of birth: May 09, 1970  Office Visit Note: Visit Date: 01/10/2024 PCP: Duanne Butler DASEN, MD Referred by: Duanne Butler DASEN, MD  Subjective: Chief Complaint  Patient presents with   Left Knee - Follow-up   HPI: Tammy Melton is a pleasant 54 y.o. female who presents today for follow-up of chronic left knee pain, here for MRI review.  Tammy Melton is still having rather bothersome pain in the left knee.  She has been able to start the home exercises, tolerating well other than forward flexion and wall squat over the knee which does reproduce pain anteriorly.  Pain continues more so over the medial aspect with clicking and catching.  Did use meloxicam  15 mg in the short-term and uses as needed, some relief.  Here for MRI review and discussion on next steps.  Pertinent ROS were reviewed with the patient and found to be negative unless otherwise specified above in HPI.   Assessment & Plan: Visit Diagnoses:  1. Unilateral primary osteoarthritis, left knee   2. 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 Effusion, left knee   4. Patellofemoral arthralgia of left knee    Plan: Impression is chronic and ongoing left knee pain which is multifactorial in nature.  Review of her MRI does show higher grade cartilage loss over the medial tibiofemoral joint space which is at least moderate and a few pockets of high-grade cartilage loss. There is also a degenerative tear of the posterior horn of the medial meniscus and reactive knee effusion. Lesser cartilage loss but present with fissuring of patellofemoral space.  We had a discussion today regarding her meniscus tear, I do not think the quality of this warrants arthroscopy with meniscal repair.  Meniscectomy is not a good option given this is the region over her medial joint space that has higher grade cartilage loss, would likely accelerate her OA.  Given this, did discuss other  conservative options such as trialing viscosupplementation for the knee joint. She is open to pursuing this, referral sent today.  We did discuss upgrading her home therapy to formal PT, she will continue her HEP.  For now, slight modifications were made today.  If for some reason she is not making good relief after viscosupplementation injection and above, could consider formal PT down the road.  Will wear her knee sleeve when she is up and moving for support and swelling control.  She will cycle short-term use of meloxicam  15 mg for a week at a time and then off again as needed.   Surgical consideration: Unilateral TKA (medial)  Follow-up: Return for Will return once approved for viscosupplementation (perform under US ).   Meds & Orders: No orders of the defined types were placed in this encounter.   Orders Placed This Encounter  Procedures   Ambulatory request for injection medication    Procedures: No procedures performed      Clinical History: No specialty comments available.  She reports that she has never smoked. She has never used smokeless tobacco. No results for input(s): HGBA1C, LABURIC in the last 8760 hours.  Objective:    Physical Exam  Gen: Well-appearing, in no acute distress; non-toxic CV: Well-perfused. Warm.  Resp: Breathing unlabored on room air; no wheezing. Psych: Fluid speech in conversation; appropriate affect; normal thought process  Ortho Exam - Left knee: Trace to small effusion present in the knee joint.  Positive TTP over the medial joint line.  There is a degree of micro patellar instability with patellofemoral crepitus through flexion and extension.  Positive McMurray's testing over the medial compartment.   Imaging:  *I did independent review the left knee x-ray myself as well as with the patient in the room today.  MR Knee Left w/o contrast CLINICAL DATA:  Knee pain.  EXAM: MRI OF THE LEFT KNEE WITHOUT CONTRAST  TECHNIQUE: Multiplanar,  multisequence MR imaging of the knee was performed. No intravenous contrast was administered.  COMPARISON:  Left knee radiographs dated 11/11/2023.  FINDINGS: MENISCI  Medial: Degeneration and horizontal tear of the posterior horn of the medial meniscus.  Lateral: Intact.  LIGAMENTS  Cruciates: ACL and PCL are intact.  Collaterals: Medial collateral ligament is intact. Lateral collateral ligament complex is intact.  CARTILAGE  Patellofemoral: Partial-thickness cartilage loss of the medial patellar facet with fissuring.  Medial: Moderate to high-grade partial-thickness cartilage loss along the central weight-bearing portion of the medial femoral condyle.  Lateral:  No chondral defect.  JOINT: Small joint effusion. Normal Hoffa's fat-pad. No plical thickening.  POPLITEAL FOSSA: Popliteus tendon is intact. Tiny Baker's cyst.  EXTENSOR MECHANISM: Intact quadriceps tendon. Intact patellar tendon. Intact lateral patellar retinaculum. Intact medial patellar retinaculum.  BONES: No aggressive osseous lesion. No fracture or dislocation.  Other: Cluster of T2 hyperintense cystic foci about the proximal tibiofibular syndesmosis and extending into the adjacent posterior aspect of the proximal anterior tibialis muscle measuring up to 10 x 5 mm, favored to represent ganglion/synovial cysts (series 106, images 25-30). Muscles are otherwise unremarkable.  IMPRESSION: 1. Degeneration and horizontal tear of the posterior horn of the medial meniscus. 2. Medial and patellofemoral compartment osteoarthritis with cartilage abnormalities as described above. 3. Cluster of ganglion/synovial cysts about the proximal tibiofibular syndesmosis extending into the adjacent posterior aspect of the proximal anterior tibialis muscle measuring up to 10 x 5 mm. 4. Small knee joint effusion.  Electronically Signed   By: Harrietta Sherry M.D.   On: 01/02/2024 18:41   Past  Medical/Family/Surgical/Social History: Medications & Allergies reviewed per EMR, new medications updated. Patient Active Problem List   Diagnosis Date Noted   Abdominal discomfort, epigastric 02/03/2021   Chronic GERD    Endometriosis 02/14/2018   High blood pressure    Migraines    Past Medical History:  Diagnosis Date   Anemia    Angio-edema    Depression    Eczema    Gastric erythema    GERD (gastroesophageal reflux disease)    High blood pressure    h/o   Migraines    Urticaria    Family History  Problem Relation Age of Onset   Allergic rhinitis Mother    Asthma Brother    Breast cancer Cousin    Past Surgical History:  Procedure Laterality Date   CESAREAN SECTION     COLONOSCOPY N/A 12/12/2020   Procedure: COLONOSCOPY;  Surgeon: Unk Corinn Skiff, MD;  Location: [facility];  Service: Gastroenterology;  Laterality: N/A;   ESOPHAGOGASTRODUODENOSCOPY (EGD) WITH PROPOFOL  N/A 12/12/2020   Procedure: ESOPHAGOGASTRODUODENOSCOPY (EGD) WITH PROPOFOL ;  Surgeon: Unk Corinn Skiff, MD;  Location: [facility];  Service: Gastroenterology;  Laterality: N/A;   TUBAL LIGATION     Social History   Occupational History   Not on file  Tobacco Use   Smoking status: Never   Smokeless tobacco: Never  Vaping Use   Vaping status: Never Used  Substance and Sexual Activity   Alcohol use: No   Drug use: No   Sexual  activity: Not on file   "

## 2024-01-17 ENCOUNTER — Ambulatory Visit: Admitting: Sports Medicine

## 2024-01-25 ENCOUNTER — Other Ambulatory Visit: Payer: Self-pay

## 2024-01-25 ENCOUNTER — Other Ambulatory Visit: Payer: Self-pay | Admitting: Family Medicine
